# Patient Record
Sex: Male | Born: 1955 | Race: White | Hispanic: No | Marital: Married | Smoking: Never smoker
Health system: Southern US, Community
[De-identification: ages and names within clinical notes are randomized; demographics above are authoritative.]

## PROBLEM LIST (undated history)

## (undated) DIAGNOSIS — E785 Hyperlipidemia, unspecified: Secondary | ICD-10-CM

## (undated) DIAGNOSIS — R7989 Other specified abnormal findings of blood chemistry: Secondary | ICD-10-CM

## (undated) DIAGNOSIS — C44621 Squamous cell carcinoma of skin of unspecified upper limb, including shoulder: Secondary | ICD-10-CM

## (undated) DIAGNOSIS — Z8679 Personal history of other diseases of the circulatory system: Secondary | ICD-10-CM

## (undated) DIAGNOSIS — M51369 Other intervertebral disc degeneration, lumbar region without mention of lumbar back pain or lower extremity pain: Secondary | ICD-10-CM

## (undated) DIAGNOSIS — J189 Pneumonia, unspecified organism: Secondary | ICD-10-CM

## (undated) HISTORY — DX: Personal history of other diseases of the circulatory system: Z86.79

## (undated) HISTORY — PX: CYSTECTOMY: SUR359

## (undated) HISTORY — DX: Other intervertebral disc degeneration, lumbar region without mention of lumbar back pain or lower extremity pain: M51.369

## (undated) HISTORY — PX: LESION REMOVAL: SHX5196

## (undated) HISTORY — DX: Hyperlipidemia, unspecified: E78.5

## (undated) HISTORY — PX: HERNIA REPAIR: SHX51

---

## 2012-04-12 HISTORY — PX: COLONOSCOPY: SHX174

## 2012-07-05 DIAGNOSIS — E669 Obesity, unspecified: Secondary | ICD-10-CM | POA: Insufficient documentation

## 2012-07-05 DIAGNOSIS — E782 Mixed hyperlipidemia: Secondary | ICD-10-CM | POA: Insufficient documentation

## 2015-05-13 DIAGNOSIS — R7989 Other specified abnormal findings of blood chemistry: Secondary | ICD-10-CM | POA: Insufficient documentation

## 2016-01-21 DIAGNOSIS — Z85828 Personal history of other malignant neoplasm of skin: Secondary | ICD-10-CM | POA: Insufficient documentation

## 2017-04-12 HISTORY — PX: ESOPHAGOGASTRODUODENOSCOPY ENDOSCOPY: SHX5814

## 2017-08-02 ENCOUNTER — Other Ambulatory Visit: Payer: Self-pay | Admitting: Internal Medicine

## 2017-08-02 DIAGNOSIS — R599 Enlarged lymph nodes, unspecified: Secondary | ICD-10-CM

## 2017-08-09 ENCOUNTER — Ambulatory Visit
Admission: RE | Admit: 2017-08-09 | Discharge: 2017-08-09 | Disposition: A | Discharge status: Home / Self Care | Payer: BLUE CROSS/BLUE SHIELD | Source: Ambulatory Visit | Attending: Internal Medicine | Admitting: Internal Medicine

## 2017-08-09 DIAGNOSIS — M791 Myalgia, unspecified site: Secondary | ICD-10-CM | POA: Insufficient documentation

## 2017-08-09 DIAGNOSIS — R599 Enlarged lymph nodes, unspecified: Secondary | ICD-10-CM

## 2017-09-09 ENCOUNTER — Ambulatory Visit: Payer: Self-pay | Admitting: Family Medicine

## 2017-09-15 ENCOUNTER — Ambulatory Visit: Payer: Self-pay | Admitting: Family Medicine

## 2017-09-16 ENCOUNTER — Other Ambulatory Visit: Payer: Self-pay | Admitting: Otolaryngology

## 2017-09-16 DIAGNOSIS — R1310 Dysphagia, unspecified: Secondary | ICD-10-CM

## 2017-09-16 DIAGNOSIS — R221 Localized swelling, mass and lump, neck: Secondary | ICD-10-CM

## 2017-09-18 ENCOUNTER — Other Ambulatory Visit: Payer: Self-pay | Admitting: Radiology

## 2017-09-20 ENCOUNTER — Ambulatory Visit: Payer: Self-pay | Admitting: Family Medicine

## 2017-09-20 ENCOUNTER — Other Ambulatory Visit: Payer: Self-pay | Admitting: Otolaryngology

## 2017-09-20 ENCOUNTER — Ambulatory Visit
Admission: RE | Admit: 2017-09-20 | Discharge: 2017-09-20 | Disposition: A | Discharge status: Home / Self Care | Payer: BLUE CROSS/BLUE SHIELD | Source: Ambulatory Visit | Attending: Otolaryngology | Admitting: Otolaryngology

## 2017-09-20 DIAGNOSIS — Z8249 Family history of ischemic heart disease and other diseases of the circulatory system: Secondary | ICD-10-CM | POA: Diagnosis not present

## 2017-09-20 DIAGNOSIS — Z823 Family history of stroke: Secondary | ICD-10-CM | POA: Diagnosis not present

## 2017-09-20 DIAGNOSIS — Z881 Allergy status to other antibiotic agents status: Secondary | ICD-10-CM | POA: Diagnosis not present

## 2017-09-20 DIAGNOSIS — Z880 Allergy status to penicillin: Secondary | ICD-10-CM | POA: Diagnosis not present

## 2017-09-20 DIAGNOSIS — R221 Localized swelling, mass and lump, neck: Secondary | ICD-10-CM | POA: Diagnosis not present

## 2017-09-20 DIAGNOSIS — R1314 Dysphagia, pharyngoesophageal phase: Secondary | ICD-10-CM | POA: Diagnosis not present

## 2017-09-20 NOTE — Progress Notes (Signed)
Patient post neck mass biopsy per Dr Vernard Gambles, tolerated well. Vitals stable. Denies complaints. bandade dressing dry/intact. Discharge instructions given with questions answered.

## 2017-09-20 NOTE — Procedures (Signed)
  Procedure: [place] core L subcutaneous mass 18g x3 EBL:   minimal Complications:  none immediate  See full dictation in [facility].  Dillard Cannon MD Main # [phone] Pager  [phone]

## 2017-09-20 NOTE — Discharge Instructions (Signed)
Needle Biopsy, Care After °These instructions give you information about caring for yourself after your procedure. Your doctor may also give you more specific instructions. Call your doctor if you have any problems or questions after your procedure. °Follow these instructions at home: °· Rest as told by your doctor. °· Take medicines only as told by your doctor. °· There are many different ways to close and cover the biopsy site, including stitches (sutures), skin glue, and adhesive strips. Follow instructions from your doctor about: °? How to take care of your biopsy site. °? When and how you should change your bandage (dressing). °? When you should remove your dressing. °? Removing whatever was used to close your biopsy site. °· Check your biopsy site every day for signs of infection. Watch for: °? Redness, swelling, or pain. °? Fluid, blood, or pus. °Contact a doctor if: °· You have a fever. °· You have redness, swelling, or pain at the biopsy site, and it lasts longer than a few days. °· You have fluid, blood, or pus coming from the biopsy site. °· You feel sick to your stomach (nauseous). °· You throw up (vomit). °Get help right away if: °· You are short of breath. °· You have trouble breathing. °· Your chest hurts. °· You feel dizzy or you pass out (faint). °· You have bleeding that does not stop with pressure or a bandage. °· You cough up blood. °· Your belly (abdomen) hurts. °This information is not intended to replace advice given to you by your health care provider. Make sure you discuss any questions you have with your health care provider. °Document Released: 03/11/2008 Document Revised: 09/04/2015 Document Reviewed: 03/25/2014 °Elsevier Interactive Patient Education © 2018 [employer] ° °

## 2017-09-21 LAB — SURGICAL PATHOLOGY

## 2017-10-11 ENCOUNTER — Ambulatory Visit
Admission: RE | Admit: 2017-10-11 | Discharge: 2017-10-11 | Disposition: A | Discharge status: Home / Self Care | Payer: BLUE CROSS/BLUE SHIELD | Source: Ambulatory Visit | Attending: Otolaryngology | Admitting: Otolaryngology

## 2017-10-11 DIAGNOSIS — R1310 Dysphagia, unspecified: Secondary | ICD-10-CM | POA: Diagnosis not present

## 2017-10-11 NOTE — Therapy (Signed)
[place], [place], [address] Phone: [phone]   Fax:     Modified Barium Swallow  Patient Details  Name: Jeremy Clements MRN: [id number] Date of Birth: [birth year] No data recorded  Encounter Date: 10/11/2017  End of Session - 10/11/17 1347    Visit Number  1    Number of Visits  1    Date for SLP Re-Evaluation  10/11/17       No past medical history on file. .  There were no vitals filed for this visit.   Subjective: Patient behavior: (alertness, ability to follow instructions, etc.): The patient is able to express his swallowing complaints and follow directions  Chief complaint: Patient reports sensation of "blockage" (indicates entire length of esophagus, "anywhere between here and here"), pain (esophagus), and sense that foods/liquid move slowly through the esophagus.   Objective:  Radiological Procedure: A videoflouroscopic evaluation of oral-preparatory, reflex initiation, and pharyngeal phases of the swallow was performed; as well as a screening of the upper esophageal phase.  I. POSTURE: Upright in MBS chair and standing  II. VIEW:  Lateral and A-P  III. COMPENSATORY STRATEGIES: N/A  IV. BOLUSES ADMINISTERED:   Thin Liquid: 1 small, 3 rapid consecutive   Nectar-thick Liquid: 2 moderate   Honey-thick Liquid: DNT   Puree: 3 teaspoon presentations   Mechanical Soft: 1/4 graham cracker in applesauce  V. RESULTS OF EVALUATION: A. ORAL PREPARATORY PHASE: (The lips, tongue, and velum are observed for strength and coordination)       **Overall Severity Rating: Within normal limits  B. SWALLOW INITIATION/REFLEX: (The reflex is normal if "triggered" by the time the bolus reached the base of the tongue)  **Overall Severity Rating: Within normal limits  C. PHARYNGEAL PHASE: (Pharyngeal function is normal if the bolus shows rapid, smooth, and continuous transit through the  pharynx and there is no pharyngeal residue after the swallow)  **Overall Severity Rating: Within normal limits  D. LARYNGEAL PENETRATION: (Material entering into the laryngeal inlet/vestibule but not aspirated) Transient laryngeal penetration X2  E. ASPIRATION: None  F. ESOPHAGEAL PHASE: (Screening of the upper esophagus) .  An esophageal sweep in the upright position with liquid and solid consistencies showed distal-esophageal retention (momentary) with no observed retrograde flow below the level of the pharyngoesophageal segment.   The patient indicated his mid-esophagus when reporting point of "blockage" and occasional pain and subjective feeling of slow movement through the esophagus with liquid bolus.    ASSESSMENT: This 62 year old man; with left neck mass and complaint of difficulty swallowing solids and liquids; is presenting with functional oropharyngeal swallowing.  Oral control of the bolus including oral hold, rotary mastication, and anterior to posterior transfer is within normal limits.  Timing of pharyngeal swallow initiation is within normal limits.  Aspects of the pharyngeal stage of swallowing including tongue base retraction, hyolaryngeal excursion, epiglottic inversion, and duration/amplitude of UES opening are within normal limits.  There is no significant pharyngeal residue or tracheal aspiration.  The patient's complaints do not appear to be due to oropharyngeal swallow function.  An esophageal sweep in the upright position with liquid and solid consistencies showed distal-esophageal retention (momentary) with no observed retrograde flow below the level of the pharyngoesophageal segment.   The patient indicated his mid-esophagus when reporting point of "blockage" and occasional pain and subjective feeling of slow movement through the esophagus with liquid bolus.  The patient may benefit from further evaluation with GI  to assess esophageal function.  PLAN/RECOMMENDATIONS:   A.  Diet: Regular, soften moisten as needed for comfort   B. Swallowing Precautions: no special precautions indicated   C. Recommended consultation to: GI   D. Therapy recommendations: speech therapy is not indicated   E. Results and recommendations were discussed with the patient immediately following the study and the final report routed to the referring MD  Dysphagia, unspecified type - Plan: DG Swallowing Func-Speech Pathology, DG Swallowing Func-Speech Pathology        Problem List There are no active problems to display for this patient.  Leroy Sea, MS/CCC- SLP  Lou Miner 10/11/2017, 1:47 PM  [facility] [place], [place], [address] Phone: [phone]   Fax:     Name: Jeremy Clements MRN: [id number] Date of Birth: 30-Sep-1955

## 2017-11-10 ENCOUNTER — Ambulatory Visit: Payer: BLUE CROSS/BLUE SHIELD | Admitting: Gastroenterology

## 2018-01-16 DIAGNOSIS — K224 Dyskinesia of esophagus: Secondary | ICD-10-CM | POA: Insufficient documentation

## 2020-03-17 DIAGNOSIS — M6530 Trigger finger, unspecified finger: Secondary | ICD-10-CM | POA: Insufficient documentation

## 2020-03-17 DIAGNOSIS — M19049 Primary osteoarthritis, unspecified hand: Secondary | ICD-10-CM | POA: Insufficient documentation

## 2020-05-13 DIAGNOSIS — K439 Ventral hernia without obstruction or gangrene: Secondary | ICD-10-CM

## 2020-05-13 HISTORY — DX: Ventral hernia without obstruction or gangrene: K43.9

## 2020-05-22 ENCOUNTER — Other Ambulatory Visit: Payer: Self-pay | Admitting: General Surgery

## 2020-05-22 DIAGNOSIS — K439 Ventral hernia without obstruction or gangrene: Secondary | ICD-10-CM

## 2020-05-29 ENCOUNTER — Other Ambulatory Visit: Payer: Self-pay

## 2020-05-29 ENCOUNTER — Ambulatory Visit
Admission: RE | Admit: 2020-05-29 | Discharge: 2020-05-29 | Disposition: A | Discharge status: Home / Self Care | Payer: BC Managed Care – PPO | Source: Ambulatory Visit | Attending: General Surgery | Admitting: General Surgery

## 2020-05-29 DIAGNOSIS — K439 Ventral hernia without obstruction or gangrene: Secondary | ICD-10-CM | POA: Insufficient documentation

## 2020-05-29 MED ORDER — IOHEXOL 300 MG/ML  SOLN
100.0000 mL | Freq: Once | INTRAMUSCULAR | Status: AC | PRN
  Administered 2020-05-29: 100 mL via INTRAVENOUS

## 2020-06-05 ENCOUNTER — Ambulatory Visit: Payer: Self-pay | Admitting: General Surgery

## 2020-06-05 NOTE — H&P (View-Only) (Signed)
PATIENT PROFILE: Jeremy Clements is a 65 y.o. male who presents to the Clinic for consultation at the request of Dr. Sabra Clements for evaluation of umbilical hernia.  PCP: Jeremy Pax, MD  HISTORY OF PRESENT ILLNESS: Jeremy Clements reports patient report feeling a knot on the periumbilical area since a years ago. Initially he was thought that it was a lipoma and the recommendation was to proceed with observation. In the last few weeks he has been feeling more pain on the left side of the abdomen. The pain does not radiate to other part of the body. Aggravating factor is doing heavy lifting or pulling. He reports that the not in the periumbilical area fluctuates in size. He associated increase in size when he eats or when he does have lifting. He then feels that the not decreased a little bit in size. He does not have pain on the periumbilical area and the pain is always on the left side of the abdomen.  He also reports that many many years ago he used to have an area of the periumbilical area that he can remove a lot of hair coming from the inside of the skin. He denies any previous infection.  PROBLEM LIST: Problem List Date Reviewed: 07/12/2019  Noted  History of 2019 novel coronavirus disease (COVID-19) 07/16/2019  Overview  3/21, monoclonal antibody   Esophageal dysmotility 01/16/2018  Overview  Seen on EGD 10/19, [facility], manometry pending   Low serum testosterone level 05/13/2015  Overview  222, 2/20, no treatment   Hyperlipidemia, mixed 07/05/2012  Overview  Overview:  Chronic [Hyperlipidemia]     GENERAL REVIEW OF SYSTEMS:   General ROS: negative for - chills, fatigue, fever, weight gain or weight loss Allergy and Immunology ROS: negative for - hives  Hematological and Lymphatic ROS: negative for - bleeding problems or bruising, negative for palpable nodes Endocrine ROS: negative for - heat or cold intolerance, hair changes Respiratory ROS: negative for - cough, shortness of  breath or wheezing Cardiovascular ROS: no chest pain or palpitations GI ROS: negative for nausea, vomiting, abdominal pain, diarrhea, constipation Musculoskeletal ROS: negative for - joint swelling or muscle pain Neurological ROS: negative for - confusion, syncope Dermatological ROS: negative for pruritus and rash Psychiatric: negative for anxiety, depression, difficulty sleeping and memory loss  MEDICATIONS: Current Outpatient Medications  Medication Sig Dispense Refill  . ascorbic acid, vitamin C, (VITAMIN C) 1000 MG tablet Take 1,000 mg by mouth once daily  . cholecalciferol (VITAMIN D3) 1000 unit capsule Take 1,000 Units by mouth once daily  . ELDERBERRY FRUIT ORAL Take by mouth  . zinc sulfate (ZINC-15 ORAL) Take by mouth   No current facility-administered medications for this visit.   ALLERGIES: Tetracyclines and Amoxicillin  PAST MEDICAL HISTORY: Past Medical History:  Diagnosis Date  . Low testosterone   PAST SURGICAL HISTORY: Past Surgical History:  Procedure Laterality Date  . cystectomy on testicle  . ESOPHAGEAL DILATION N/A 01/12/2018  Procedure: FLIP; Surgeon: Leda Min, MD; Location: [facility]; Service: Gastroenterology; Laterality: N/A; This procedure is performed only by: Dr. Nancie Neas, Dr. Dorris Singh, and Dr. Ardelle Lesches  . ESOPHAGOGASTRODOUDENOSCOPY W/BIOPSY N/A 01/12/2018  Procedure: EGD with placement FLIP balloon to measure esophageal distensibility; Surgeon: Leda Min, MD; Location: [facility]; Service: Gastroenterology; Laterality: N/A; This procedure is performed only by: Dr. Nancie Neas, Dr. Dorris Singh, and Dr. Ardelle Lesches    FAMILY HISTORY: Family History  Problem Relation Age of Onset  . Rheum  arthritis Mother  . Leukemia Mother  . Rheum arthritis Maternal Grandmother  . Stroke Father  . Rheum arthritis Sister  . Dysphagia Sister    SOCIAL HISTORY: Social History   Socioeconomic History  .  Marital status: Married  Spouse name: Jeremy Clements  . Number of children: 6  . Years of education: Not on file  . Highest education level: Not on file  Occupational History  . Occupation: Pastor  Tobacco Use  . Smoking status: Never Smoker  . Smokeless tobacco: Never Used  Vaping Use  . Vaping Use: Never used  Substance and Sexual Activity  . Alcohol use: Never  Alcohol/week: 0.0 standard drinks  . Drug use: Never  . Sexual activity: Yes  Partners: Female  Birth control/protection: None  Other Topics Concern  . Not on file  Social History Narrative  . Not on file   Social Determinants of Health   Financial Resource Strain: Not on file  Food Insecurity: Not on file  Transportation Needs: Not on file   PHYSICAL EXAM: Vitals:  05/22/20 1051  BP: (!) 151/82  Pulse: 58   Body mass index is 36.59 kg/m. Weight: (!) 129.3 kg (285 lb)   GENERAL: Alert, active, oriented x3  HEENT: Pupils equal reactive to light. Extraocular movements are intact. Sclera clear. Palpebral conjunctiva normal red color.Pharynx clear.  NECK: Supple with no palpable mass and no adenopathy.  LUNGS: Sound clear with no rales rhonchi or wheezes.  HEART: Regular rhythm S1 and S2 without murmur.  ABDOMEN: Soft and depressible, nontender, no hepatomegaly. There is a lump in the periumbilical area superior and left lateral to the umbilicus. I partially reduce the content of suspected ventral hernia. No skin changes.  EXTREMITIES: Well-developed well-nourished symmetrical with no dependent edema.  NEUROLOGICAL: Awake alert oriented, facial expression symmetrical, moving all extremities.  REVIEW OF DATA: I have reviewed the following data today: Initial consult on 05/22/2020  Component Date Value  . Glucose 05/22/2020 108  . Sodium 05/22/2020 138  . Potassium 05/22/2020 4.8  . Chloride 05/22/2020 104  . Carbon Dioxide (CO2) 05/22/2020 32.2 (!)  . Urea Nitrogen (BUN) 05/22/2020 19  . Creatinine  05/22/2020 1.0  . Glomerular Filtration Ra* 05/22/2020 75  . Calcium 05/22/2020 9.5  . AST 05/22/2020 18  . ALT 05/22/2020 20  . Alk Phos (alkaline Phosp* 05/22/2020 71  . Albumin 05/22/2020 4.5  . Bilirubin, Total 05/22/2020 0.8  . Protein, Total 05/22/2020 7.0  . A/G Ratio 05/22/2020 1.8  Office Visit on 05/20/2020  Component Date Value  . SARS-CoV-2 Semi-Quant To* 05/20/2020 131.8  . SARS-CoV-2 Spike Ab Inte* 05/20/2020 Positive  Appointment on 03/17/2020  Component Date Value  . C Reactive Protein - Lab* 03/17/2020 4  . RA Latex Turbid. - [facility]* 03/17/2020 <10.0  . CCP Antibodies IgG/IgA -* 03/17/2020 4  . WBC (White Blood Cell Co* 03/17/2020 8.9  . RBC (Red Blood Cell Coun* 03/17/2020 5.10  . Hemoglobin 03/17/2020 15.8  . Hematocrit 03/17/2020 47.6  . MCV (Mean Corpuscular Vo* 03/17/2020 93.3  . MCH (Mean Corpuscular He* 03/17/2020 31.0  . MCHC (Mean Corpuscular H* 03/17/2020 33.2  . Platelet Count 03/17/2020 220  . RDW-CV (Red Cell Distrib* 03/17/2020 12.3  . MPV (Mean Platelet Volum* 03/17/2020 9.9  . Neutrophils 03/17/2020 5.98  . Lymphocytes 03/17/2020 2.07  . Monocytes 03/17/2020 0.61  . Eosinophils 03/17/2020 0.16  . Basophils 03/17/2020 0.05  . Neutrophil % 03/17/2020 67.1  . Lymphocyte % 03/17/2020 23.3  . Monocyte %  03/17/2020 6.9  . Eosinophil % 03/17/2020 1.8  . Basophil% 03/17/2020 0.6  . Immature Granulocyte % 03/17/2020 0.3  . Immature Granulocyte Cou* 03/17/2020 0.03  . Glucose 03/17/2020 81  . Sodium 03/17/2020 140  . Potassium 03/17/2020 4.4  . Chloride 03/17/2020 106  . Carbon Dioxide (CO2) 03/17/2020 27.5  . Urea Nitrogen (BUN) 03/17/2020 12  . Creatinine 03/17/2020 1.0  . Glomerular Filtration Ra* 03/17/2020 76  . Calcium 03/17/2020 9.3  . AST 03/17/2020 20  . ALT 03/17/2020 22  . Alk Phos (alkaline Phosp* 03/17/2020 60  . Albumin 03/17/2020 4.5  . Bilirubin, Total 03/17/2020 0.4  . Protein, Total 03/17/2020 6.8  . A/G Ratio 03/17/2020  2.0    ASSESSMENT: Mr. Lohmeyer is a 65 y.o. male presenting for consultation for umbilical hernia.  This patient with physical exam mostly consistent with a partially reducible umbilical hernia. The fact that the pain is not localized to the periumbilical area and is more to the left side of the abdomen and with this history of having an area around the umbilicus where he used to remove lots of hair I am concerned that multiple processes can be happening in this area. 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 My recommendation is to proceed with CT scan of the abdomen and pelvis for adequate evaluation of the abdominal wall before proceeding with surgical management. CT scan will help with decision-making regarding the surgery and next step of management.  CT scan was ordered and ventral hernia was confirmed without any other pathology. On imaging, hernia content is fat tissue. Patient oriented about findings and recommendations of hernia repair. Oriented about risk of surgery that includes bleeding, infection, injury to intestine or other organs, pain, fistula, scar, among others. Patient reports understood and agreed to proceed.   Ventral hernia without obstruction or gangrene [K43.9]  PLAN: 1. Robotic assisted laparoscopic ventral hernia repair with mesh  Patient and and his wife verbalized understanding, all questions were answered, and were agreeable with the plan outlined above.   Herbert Pun, MD  Electronically signed by Herbert Pun, MD

## 2020-06-05 NOTE — H&P (Signed)
PATIENT PROFILE: Jeremy Clements is a 65 y.o. male who presents to the Clinic for consultation at the request of Dr. Miller for evaluation of umbilical hernia.  PCP: Miller, Mark Frederic, MD  HISTORY OF PRESENT ILLNESS: Jeremy Clements reports patient report feeling a knot on the periumbilical area since a years ago. Initially he was thought that it was a lipoma and the recommendation was to proceed with observation. In the last few weeks he has been feeling more pain on the left side of the abdomen. The pain does not radiate to other part of the body. Aggravating factor is doing heavy lifting or pulling. He reports that the not in the periumbilical area fluctuates in size. He associated increase in size when he eats or when he does have lifting. He then feels that the not decreased a little bit in size. He does not have pain on the periumbilical area and the pain is always on the left side of the abdomen.  He also reports that many many years ago he used to have an area of the periumbilical area that he can remove a lot of hair coming from the inside of the skin. He denies any previous infection.  PROBLEM LIST: Problem List Date Reviewed: 07/12/2019  Noted  History of 2019 novel coronavirus disease (COVID-19) 07/16/2019  Overview  3/21, monoclonal antibody   Esophageal dysmotility 01/16/2018  Overview  Seen on EGD 10/19, [facility], manometry pending   Low serum testosterone level 05/13/2015  Overview  222, 2/20, no treatment   Hyperlipidemia, mixed 07/05/2012  Overview  Overview:  Chronic [Hyperlipidemia]     GENERAL REVIEW OF SYSTEMS:   General ROS: negative for - chills, fatigue, fever, weight gain or weight loss Allergy and Immunology ROS: negative for - hives  Hematological and Lymphatic ROS: negative for - bleeding problems or bruising, negative for palpable nodes Endocrine ROS: negative for - heat or cold intolerance, hair changes Respiratory ROS: negative for - cough, shortness of  breath or wheezing Cardiovascular ROS: no chest pain or palpitations GI ROS: negative for nausea, vomiting, abdominal pain, diarrhea, constipation Musculoskeletal ROS: negative for - joint swelling or muscle pain Neurological ROS: negative for - confusion, syncope Dermatological ROS: negative for pruritus and rash Psychiatric: negative for anxiety, depression, difficulty sleeping and memory loss  MEDICATIONS: Current Outpatient Medications  Medication Sig Dispense Refill  . ascorbic acid, vitamin C, (VITAMIN C) 1000 MG tablet Take 1,000 mg by mouth once daily  . cholecalciferol (VITAMIN D3) 1000 unit capsule Take 1,000 Units by mouth once daily  . ELDERBERRY FRUIT ORAL Take by mouth  . zinc sulfate (ZINC-15 ORAL) Take by mouth   No current facility-administered medications for this visit.   ALLERGIES: Tetracyclines and Amoxicillin  PAST MEDICAL HISTORY: Past Medical History:  Diagnosis Date  . Low testosterone   PAST SURGICAL HISTORY: Past Surgical History:  Procedure Laterality Date  . cystectomy on testicle  . ESOPHAGEAL DILATION N/A 01/12/2018  Procedure: FLIP; Surgeon: Shimpi, Rahul Arun, MD; Location: [facility]; Service: Gastroenterology; Laterality: N/A; This procedure is performed only by: Dr. Richard Wood, Dr. David Leiman, and Dr. Rahul Shimpi  . ESOPHAGOGASTRODOUDENOSCOPY W/BIOPSY N/A 01/12/2018  Procedure: EGD with placement FLIP balloon to measure esophageal distensibility; Surgeon: Shimpi, Rahul Arun, MD; Location: [facility]; Service: Gastroenterology; Laterality: N/A; This procedure is performed only by: Dr. Richard Wood, Dr. David Leiman, and Dr. Rahul Shimpi    FAMILY HISTORY: Family History  Problem Relation Age of Onset  . Rheum   arthritis Mother  . Leukemia Mother  . Rheum arthritis Maternal Grandmother  . Stroke Father  . Rheum arthritis Sister  . Dysphagia Sister    SOCIAL HISTORY: Social History   Socioeconomic History  .  Marital status: Married  Spouse name: Donna Scrivner  . Number of children: 6  . Years of education: Not on file  . Highest education level: Not on file  Occupational History  . Occupation: Pastor  Tobacco Use  . Smoking status: Never Smoker  . Smokeless tobacco: Never Used  Vaping Use  . Vaping Use: Never used  Substance and Sexual Activity  . Alcohol use: Never  Alcohol/week: 0.0 standard drinks  . Drug use: Never  . Sexual activity: Yes  Partners: Female  Birth control/protection: None  Other Topics Concern  . Not on file  Social History Narrative  . Not on file   Social Determinants of Health   Financial Resource Strain: Not on file  Food Insecurity: Not on file  Transportation Needs: Not on file   PHYSICAL EXAM: Vitals:  05/22/20 1051  BP: (!) 151/82  Pulse: 58   Body mass index is 36.59 kg/m. Weight: (!) 129.3 kg (285 lb)   GENERAL: Alert, active, oriented x3  HEENT: Pupils equal reactive to light. Extraocular movements are intact. Sclera clear. Palpebral conjunctiva normal red color.Pharynx clear.  NECK: Supple with no palpable mass and no adenopathy.  LUNGS: Sound clear with no rales rhonchi or wheezes.  HEART: Regular rhythm S1 and S2 without murmur.  ABDOMEN: Soft and depressible, nontender, no hepatomegaly. There is a lump in the periumbilical area superior and left lateral to the umbilicus. I partially reduce the content of suspected ventral hernia. No skin changes.  EXTREMITIES: Well-developed well-nourished symmetrical with no dependent edema.  NEUROLOGICAL: Awake alert oriented, facial expression symmetrical, moving all extremities.  REVIEW OF DATA: I have reviewed the following data today: Initial consult on 05/22/2020  Component Date Value  . Glucose 05/22/2020 108  . Sodium 05/22/2020 138  . Potassium 05/22/2020 4.8  . Chloride 05/22/2020 104  . Carbon Dioxide (CO2) 05/22/2020 32.2 (!)  . Urea Nitrogen (BUN) 05/22/2020 19  . Creatinine  05/22/2020 1.0  . Glomerular Filtration Ra* 05/22/2020 75  . Calcium 05/22/2020 9.5  . AST 05/22/2020 18  . ALT 05/22/2020 20  . Alk Phos (alkaline Phosp* 05/22/2020 71  . Albumin 05/22/2020 4.5  . Bilirubin, Total 05/22/2020 0.8  . Protein, Total 05/22/2020 7.0  . A/G Ratio 05/22/2020 1.8  Office Visit on 05/20/2020  Component Date Value  . SARS-CoV-2 Semi-Quant To* 05/20/2020 131.8  . SARS-CoV-2 Spike Ab Inte* 05/20/2020 Positive  Appointment on 03/17/2020  Component Date Value  . C Reactive Protein - Lab* 03/17/2020 4  . RA Latex Turbid. - Lab[facility]* 03/17/2020 <10.0  . CCP Antibodies IgG/IgA -* 03/17/2020 4  . WBC (White Blood Cell Co* 03/17/2020 8.9  . RBC (Red Blood Cell Coun* 03/17/2020 5.10  . Hemoglobin 03/17/2020 15.8  . Hematocrit 03/17/2020 47.6  . MCV (Mean Corpuscular Vo* 03/17/2020 93.3  . MCH (Mean Corpuscular He* 03/17/2020 31.0  . MCHC (Mean Corpuscular H* 03/17/2020 33.2  . Platelet Count 03/17/2020 220  . RDW-CV (Red Cell Distrib* 03/17/2020 12.3  . MPV (Mean Platelet Volum* 03/17/2020 9.9  . Neutrophils 03/17/2020 5.98  . Lymphocytes 03/17/2020 2.07  . Monocytes 03/17/2020 0.61  . Eosinophils 03/17/2020 0.16  . Basophils 03/17/2020 0.05  . Neutrophil % 03/17/2020 67.1  . Lymphocyte % 03/17/2020 23.3  . Monocyte %   03/17/2020 6.9  . Eosinophil % 03/17/2020 1.8  . Basophil% 03/17/2020 0.6  . Immature Granulocyte % 03/17/2020 0.3  . Immature Granulocyte Cou* 03/17/2020 0.03  . Glucose 03/17/2020 81  . Sodium 03/17/2020 140  . Potassium 03/17/2020 4.4  . Chloride 03/17/2020 106  . Carbon Dioxide (CO2) 03/17/2020 27.5  . Urea Nitrogen (BUN) 03/17/2020 12  . Creatinine 03/17/2020 1.0  . Glomerular Filtration Ra* 03/17/2020 76  . Calcium 03/17/2020 9.3  . AST 03/17/2020 20  . ALT 03/17/2020 22  . Alk Phos (alkaline Phosp* 03/17/2020 60  . Albumin 03/17/2020 4.5  . Bilirubin, Total 03/17/2020 0.4  . Protein, Total 03/17/2020 6.8  . A/G Ratio 03/17/2020  2.0    ASSESSMENT: Jeremy Clements is a 65 y.o. male presenting for consultation for umbilical hernia.  This patient with physical exam mostly consistent with a partially reducible umbilical hernia. The fact that the pain is not localized to the periumbilical area and is more to the left side of the abdomen and with this history of having an area around the umbilicus where he used to remove lots of hair I am concerned that multiple processes can be happening in this area. 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 My recommendation is to proceed with CT scan of the abdomen and pelvis for adequate evaluation of the abdominal wall before proceeding with surgical management. CT scan will help with decision-making regarding the surgery and next step of management.  CT scan was ordered and ventral hernia was confirmed without any other pathology. On imaging, hernia content is fat tissue. Patient oriented about findings and recommendations of hernia repair. Oriented about risk of surgery that includes bleeding, infection, injury to intestine or other organs, pain, fistula, scar, among others. Patient reports understood and agreed to proceed.   Ventral hernia without obstruction or gangrene [K43.9]  PLAN: 1. Robotic assisted laparoscopic ventral hernia repair with mesh  Patient and and his wife verbalized understanding, all questions were answered, and were agreeable with the plan outlined above.   Sahira Cataldi Cintron-Diaz, MD  Electronically signed by Nakia Koble Cintron-Diaz, MD    

## 2020-06-10 ENCOUNTER — Other Ambulatory Visit: Payer: Self-pay

## 2020-06-10 ENCOUNTER — Encounter
Admission: RE | Admit: 2020-06-10 | Discharge: 2020-06-10 | Disposition: A | Discharge status: Home / Self Care | Payer: BC Managed Care – PPO | Source: Ambulatory Visit | Attending: General Surgery | Admitting: General Surgery

## 2020-06-10 HISTORY — DX: Pneumonia, unspecified organism: J18.9

## 2020-06-10 HISTORY — DX: Other specified abnormal findings of blood chemistry: R79.89

## 2020-06-10 HISTORY — DX: Squamous cell carcinoma of skin of unspecified upper limb, including shoulder: C44.621

## 2020-06-10 NOTE — Patient Instructions (Addendum)
Your procedure is scheduled on: Monday, March 7 Report to the Registration Desk on the 1st floor of the [facility]. To find out your arrival time, please call [phone] between 1PM - 3PM on: Friday, March 4  REMEMBER: Instructions that are not followed completely may result in serious medical risk, up to and including death; or upon the discretion of your surgeon and anesthesiologist your surgery may need to be rescheduled.  Do not eat food after midnight the night before surgery.  No gum chewing, lozengers or hard candies.  You may however, drink CLEAR liquids up to 2 hours before you are scheduled to arrive for your surgery. Do not drink anything within 2 hours of your scheduled arrival time.  Clear liquids include: - water  - apple juice without pulp - gatorade (not RED, PURPLE, OR BLUE) - [place] coffee or tea (Do NOT add milk or creamers to the coffee or tea) Do NOT drink anything that is not on this list.  DO NOT TAKE ANY MEDICATIONS THE MORNING OF SURGERY  One week prior to surgery: STARTING TODAY, MARCH 1 Stop Anti-inflammatories (NSAIDS) such as Advil, Aleve, Ibuprofen, Motrin, Naproxen, Naprosyn and Aspirin based products such as Excedrin, Goodys Powder, BC Powder. Stop ANY OVER THE COUNTER supplements until after surgery.  No Alcohol for 24 hours before or after surgery.  No Smoking including e-cigarettes for 24 hours prior to surgery.  No chewable tobacco products for at least 6 hours prior to surgery.  No nicotine patches on the day of surgery.  Do not use any "recreational" drugs for at least a week prior to your surgery.  Please be advised that the combination of cocaine and anesthesia may have negative outcomes, up to and including death. If you test positive for cocaine, your surgery will be cancelled.  On the morning of surgery brush your teeth with toothpaste and water, you may rinse your mouth with mouthwash if you wish. Do not swallow any toothpaste or  mouthwash.  Do not wear jewelry, make-up, hairpins, clips or nail polish.  Do not wear lotions, powders, or perfumes.   Do not shave body from the neck down 48 hours prior to surgery just in case you cut yourself which could leave a site for infection.  Also, freshly shaved skin may become irritated if using the CHG soap.  Do not bring valuables to the hospital. [facility] is not responsible for any missing/lost belongings or valuables.   Use CHG Soap as directed on instruction sheet.  Notify your doctor if there is any change in your medical condition (cold, fever, infection).  Wear comfortable clothing (specific to your surgery type) to the hospital.  Plan for stool softeners for home use; pain medications have a tendency to cause constipation. You can also help prevent constipation by eating foods high in fiber such as fruits and vegetables and drinking plenty of fluids as your diet allows.  After surgery, you can help prevent lung complications by doing breathing exercises.  Take deep breaths and cough every 1-2 hours. Your doctor may order a device called an Incentive Spirometer to help you take deep breaths. When coughing or sneezing, hold a pillow firmly against your incision with both hands. This is called "splinting." Doing this helps protect your incision. It also decreases belly discomfort.  If you are being discharged the day of surgery, you will not be allowed to drive home. You will need a responsible adult (18 years or older) to drive you home  and stay with you that night.   If you are taking public transportation, you will need to have a responsible adult (18 years or older) with you. Please confirm with your physician that it is acceptable to use public transportation.   Please call the [facility] at [phone] if you have any questions about these instructions.  Surgery Visitation Policy:  Patients undergoing a surgery or procedure may have  one family member or support person with them as long as that person is not COVID-19 positive or experiencing its symptoms.  That person may remain in the waiting area during the procedure.

## 2020-06-12 ENCOUNTER — Other Ambulatory Visit: Payer: Self-pay

## 2020-06-12 ENCOUNTER — Other Ambulatory Visit
Admission: RE | Admit: 2020-06-12 | Discharge: 2020-06-12 | Disposition: A | Discharge status: Home / Self Care | Payer: BC Managed Care – PPO | Source: Ambulatory Visit | Attending: General Surgery | Admitting: General Surgery

## 2020-06-12 DIAGNOSIS — Z20822 Contact with and (suspected) exposure to covid-19: Secondary | ICD-10-CM | POA: Diagnosis not present

## 2020-06-12 DIAGNOSIS — Z01812 Encounter for preprocedural laboratory examination: Secondary | ICD-10-CM | POA: Insufficient documentation

## 2020-06-12 LAB — SARS CORONAVIRUS 2 (TAT 6-24 HRS): SARS Coronavirus 2: NEGATIVE

## 2020-06-15 MED ORDER — ORAL CARE MOUTH RINSE: 15.0000 mL | Freq: Once | OROMUCOSAL | Status: AC

## 2020-06-15 MED ORDER — CLINDAMYCIN PHOSPHATE 900 MG/50ML IV SOLN
900.0000 mg | INTRAVENOUS | Status: AC
  Administered 2020-06-16: 900 mg via INTRAVENOUS

## 2020-06-15 MED ORDER — LACTATED RINGERS IV SOLN: INTRAVENOUS | Status: DC

## 2020-06-15 MED ORDER — FAMOTIDINE 20 MG PO TABS: 20.0000 mg | ORAL_TABLET | Freq: Once | ORAL | Status: AC

## 2020-06-15 MED ORDER — CHLORHEXIDINE GLUCONATE 0.12 % MT SOLN
15.0000 mL | Freq: Once | OROMUCOSAL | Status: AC
  Administered 2020-06-16: 15 mL via OROMUCOSAL

## 2020-06-16 ENCOUNTER — Ambulatory Visit
Admission: RE | Admit: 2020-06-16 | Discharge: 2020-06-16 | Disposition: A | Discharge status: Home / Self Care | Payer: BC Managed Care – PPO | Attending: General Surgery | Admitting: General Surgery

## 2020-06-16 ENCOUNTER — Encounter
Admission: RE | Disposition: A | Discharge status: Home / Self Care | Payer: Self-pay | Source: Home / Self Care | Attending: General Surgery

## 2020-06-16 ENCOUNTER — Ambulatory Visit: Payer: BC Managed Care – PPO | Admitting: Anesthesiology

## 2020-06-16 ENCOUNTER — Other Ambulatory Visit: Payer: Self-pay

## 2020-06-16 ENCOUNTER — Encounter: Payer: Self-pay | Admitting: General Surgery

## 2020-06-16 DIAGNOSIS — K430 Incisional hernia with obstruction, without gangrene: Secondary | ICD-10-CM | POA: Insufficient documentation

## 2020-06-16 DIAGNOSIS — Z806 Family history of leukemia: Secondary | ICD-10-CM | POA: Diagnosis not present

## 2020-06-16 DIAGNOSIS — Z79899 Other long term (current) drug therapy: Secondary | ICD-10-CM | POA: Diagnosis not present

## 2020-06-16 DIAGNOSIS — K429 Umbilical hernia without obstruction or gangrene: Secondary | ICD-10-CM | POA: Diagnosis present

## 2020-06-16 DIAGNOSIS — Z8261 Family history of arthritis: Secondary | ICD-10-CM | POA: Insufficient documentation

## 2020-06-16 DIAGNOSIS — Z8616 Personal history of COVID-19: Secondary | ICD-10-CM | POA: Diagnosis not present

## 2020-06-16 DIAGNOSIS — Z880 Allergy status to penicillin: Secondary | ICD-10-CM | POA: Diagnosis not present

## 2020-06-16 DIAGNOSIS — Z823 Family history of stroke: Secondary | ICD-10-CM | POA: Insufficient documentation

## 2020-06-16 DIAGNOSIS — Z8379 Family history of other diseases of the digestive system: Secondary | ICD-10-CM | POA: Diagnosis not present

## 2020-06-16 SURGERY — REPAIR, HERNIA, UMBILICAL, ROBOT-ASSISTED
Anesthesia: General | Site: Abdomen

## 2020-06-16 MED ORDER — MEPERIDINE HCL 50 MG/ML IJ SOLN: 6.2500 mg | INTRAMUSCULAR | Status: DC | PRN

## 2020-06-16 MED ORDER — SUGAMMADEX SODIUM 200 MG/2ML IV SOLN
INTRAVENOUS | Status: DC | PRN
  Administered 2020-06-16: 300 mg via INTRAVENOUS

## 2020-06-16 MED ORDER — PROPOFOL 10 MG/ML IV BOLUS
INTRAVENOUS | Status: AC
  Filled 2020-06-16: qty 20

## 2020-06-16 MED ORDER — ACETAMINOPHEN 10 MG/ML IV SOLN
INTRAVENOUS | Status: AC
  Filled 2020-06-16: qty 100

## 2020-06-16 MED ORDER — CLINDAMYCIN PHOSPHATE 900 MG/50ML IV SOLN
INTRAVENOUS | Status: AC
  Filled 2020-06-16: qty 50

## 2020-06-16 MED ORDER — HYDROCODONE-ACETAMINOPHEN 5-325 MG PO TABS: 1.0000 | ORAL_TABLET | ORAL | 0 refills | Status: AC | PRN

## 2020-06-16 MED ORDER — DEXAMETHASONE SODIUM PHOSPHATE 10 MG/ML IJ SOLN
INTRAMUSCULAR | Status: DC | PRN
  Administered 2020-06-16: 10 mg via INTRAVENOUS

## 2020-06-16 MED ORDER — KETOROLAC TROMETHAMINE 30 MG/ML IJ SOLN
INTRAMUSCULAR | Status: DC | PRN
  Administered 2020-06-16: 30 mg via INTRAVENOUS

## 2020-06-16 MED ORDER — ROCURONIUM BROMIDE 10 MG/ML (PF) SYRINGE
PREFILLED_SYRINGE | INTRAVENOUS | Status: AC
  Filled 2020-06-16: qty 10

## 2020-06-16 MED ORDER — KETOROLAC TROMETHAMINE 30 MG/ML IJ SOLN
INTRAMUSCULAR | Status: AC
  Filled 2020-06-16: qty 1

## 2020-06-16 MED ORDER — LIDOCAINE HCL (PF) 2 % IJ SOLN
INTRAMUSCULAR | Status: AC
  Filled 2020-06-16: qty 5

## 2020-06-16 MED ORDER — PROMETHAZINE HCL 25 MG/ML IJ SOLN: 6.2500 mg | INTRAMUSCULAR | Status: DC | PRN

## 2020-06-16 MED ORDER — FENTANYL CITRATE (PF) 100 MCG/2ML IJ SOLN
INTRAMUSCULAR | Status: AC
  Administered 2020-06-16: 50 ug via INTRAVENOUS
  Filled 2020-06-16: qty 2

## 2020-06-16 MED ORDER — LIDOCAINE HCL (CARDIAC) PF 100 MG/5ML IV SOSY
PREFILLED_SYRINGE | INTRAVENOUS | Status: DC | PRN
  Administered 2020-06-16: 100 mg via INTRAVENOUS

## 2020-06-16 MED ORDER — OXYCODONE HCL 5 MG PO TABS
ORAL_TABLET | ORAL | Status: AC
  Filled 2020-06-16: qty 1

## 2020-06-16 MED ORDER — BUPIVACAINE-EPINEPHRINE (PF) 0.25% -1:200000 IJ SOLN
INTRAMUSCULAR | Status: AC
  Filled 2020-06-16: qty 30

## 2020-06-16 MED ORDER — DEXAMETHASONE SODIUM PHOSPHATE 10 MG/ML IJ SOLN
INTRAMUSCULAR | Status: AC
  Filled 2020-06-16: qty 1

## 2020-06-16 MED ORDER — FENTANYL CITRATE (PF) 100 MCG/2ML IJ SOLN
INTRAMUSCULAR | Status: AC
  Administered 2020-06-16: 25 ug via INTRAVENOUS
  Filled 2020-06-16: qty 2

## 2020-06-16 MED ORDER — ONDANSETRON HCL 4 MG/2ML IJ SOLN
INTRAMUSCULAR | Status: AC
  Filled 2020-06-16: qty 2

## 2020-06-16 MED ORDER — PROPOFOL 10 MG/ML IV BOLUS
INTRAVENOUS | Status: DC | PRN
  Administered 2020-06-16: 170 mg via INTRAVENOUS

## 2020-06-16 MED ORDER — FENTANYL CITRATE (PF) 100 MCG/2ML IJ SOLN
25.0000 ug | INTRAMUSCULAR | Status: DC | PRN
  Administered 2020-06-16: 50 ug via INTRAVENOUS
  Administered 2020-06-16: 25 ug via INTRAVENOUS

## 2020-06-16 MED ORDER — MIDAZOLAM HCL 2 MG/2ML IJ SOLN
INTRAMUSCULAR | Status: AC
  Filled 2020-06-16: qty 2

## 2020-06-16 MED ORDER — FENTANYL CITRATE (PF) 100 MCG/2ML IJ SOLN
INTRAMUSCULAR | Status: AC
  Filled 2020-06-16: qty 2

## 2020-06-16 MED ORDER — FENTANYL CITRATE (PF) 100 MCG/2ML IJ SOLN
INTRAMUSCULAR | Status: DC | PRN
  Administered 2020-06-16 (×2): 50 ug via INTRAVENOUS

## 2020-06-16 MED ORDER — PHENYLEPHRINE HCL (PRESSORS) 10 MG/ML IV SOLN
INTRAVENOUS | Status: DC | PRN
  Administered 2020-06-16: 100 ug via INTRAVENOUS

## 2020-06-16 MED ORDER — ONDANSETRON HCL 4 MG/2ML IJ SOLN
INTRAMUSCULAR | Status: DC | PRN
  Administered 2020-06-16: 4 mg via INTRAVENOUS

## 2020-06-16 MED ORDER — FAMOTIDINE 20 MG PO TABS
ORAL_TABLET | ORAL | Status: AC
  Administered 2020-06-16: 20 mg via ORAL
  Filled 2020-06-16: qty 1

## 2020-06-16 MED ORDER — HYDROCODONE-ACETAMINOPHEN 5-325 MG PO TABS
ORAL_TABLET | ORAL | Status: AC
  Filled 2020-06-16: qty 1

## 2020-06-16 MED ORDER — MIDAZOLAM HCL 2 MG/2ML IJ SOLN
INTRAMUSCULAR | Status: DC | PRN
  Administered 2020-06-16: 2 mg via INTRAVENOUS

## 2020-06-16 MED ORDER — BUPIVACAINE-EPINEPHRINE (PF) 0.25% -1:200000 IJ SOLN
INTRAMUSCULAR | Status: DC | PRN
  Administered 2020-06-16: 30 mL

## 2020-06-16 MED ORDER — SODIUM CHLORIDE FLUSH 0.9 % IV SOLN
INTRAVENOUS | Status: AC
  Filled 2020-06-16: qty 10

## 2020-06-16 MED ORDER — OXYCODONE HCL 5 MG PO TABS
5.0000 mg | ORAL_TABLET | Freq: Once | ORAL | Status: AC | PRN
Start: 2020-06-16 — End: 2020-06-16
  Administered 2020-06-16: 5 mg via ORAL

## 2020-06-16 MED ORDER — OXYCODONE HCL 5 MG/5ML PO SOLN: 5.0000 mg | Freq: Once | ORAL | Status: AC | PRN

## 2020-06-16 MED ORDER — ROCURONIUM BROMIDE 100 MG/10ML IV SOLN
INTRAVENOUS | Status: DC | PRN
  Administered 2020-06-16: 60 mg via INTRAVENOUS
  Administered 2020-06-16: 10 mg via INTRAVENOUS

## 2020-06-16 MED ORDER — ACETAMINOPHEN 10 MG/ML IV SOLN
INTRAVENOUS | Status: DC | PRN
  Administered 2020-06-16: 1000 mg via INTRAVENOUS

## 2020-06-16 SURGICAL SUPPLY — 49 items
ADH SKN CLS APL DERMABOND .7 (GAUZE/BANDAGES/DRESSINGS) ×1
APL PRP STRL LF DISP 70% ISPRP (MISCELLANEOUS) ×1
BAG INFUSER PRESSURE 100CC (MISCELLANEOUS) IMPLANT
BLADE SURG SZ11 CARB STEEL (BLADE) ×2 IMPLANT
CANISTER SUCT 1200ML W/VALVE (MISCELLANEOUS) ×1 IMPLANT
CHLORAPREP W/TINT 26 (MISCELLANEOUS) ×2 IMPLANT
COVER TIP SHEARS 8 DVNC (MISCELLANEOUS) ×1 IMPLANT
COVER TIP SHEARS 8MM DA VINCI (MISCELLANEOUS) ×1
COVER WAND RF STERILE (DRAPES) ×2 IMPLANT
DEFOGGER SCOPE WARMER CLEARIFY (MISCELLANEOUS) ×2 IMPLANT
DERMABOND ADVANCED (GAUZE/BANDAGES/DRESSINGS) ×1
DERMABOND ADVANCED .7 DNX12 (GAUZE/BANDAGES/DRESSINGS) ×1 IMPLANT
DRAPE ARM DVNC X/XI (DISPOSABLE) ×3 IMPLANT
DRAPE COLUMN DVNC XI (DISPOSABLE) ×1 IMPLANT
DRAPE DA VINCI XI ARM (DISPOSABLE) ×3
DRAPE DA VINCI XI COLUMN (DISPOSABLE) ×1
ELECT REM PT RETURN 9FT ADLT (ELECTROSURGICAL) ×2
ELECTRODE REM PT RTRN 9FT ADLT (ELECTROSURGICAL) ×1 IMPLANT
GLOVE SURG ENC MOIS LTX SZ6.5 (GLOVE) ×6 IMPLANT
GLOVE SURG UNDER POLY LF SZ6.5 (GLOVE) ×6 IMPLANT
GOWN STRL REUS W/ TWL LRG LVL3 (GOWN DISPOSABLE) ×3 IMPLANT
GOWN STRL REUS W/TWL LRG LVL3 (GOWN DISPOSABLE) ×8
IRRIGATOR SUCT 8 DISP DVNC XI (IRRIGATION / IRRIGATOR) IMPLANT
IRRIGATOR SUCTION 8MM XI DISP (IRRIGATION / IRRIGATOR)
IV NS 1000ML (IV SOLUTION)
IV NS 1000ML BAXH (IV SOLUTION) IMPLANT
KIT PINK PAD W/HEAD ARE REST (MISCELLANEOUS) ×2
KIT PINK PAD W/HEAD ARM REST (MISCELLANEOUS) ×1 IMPLANT
LABEL OR SOLS (LABEL) ×2 IMPLANT
MANIFOLD NEPTUNE II (INSTRUMENTS) ×2 IMPLANT
MESH VENTRALIGHT ST 4.5IN (Mesh General) ×1 IMPLANT
NDL INSUFFLATION 14GA 120MM (NEEDLE) ×1 IMPLANT
NEEDLE HYPO 22GX1.5 SAFETY (NEEDLE) ×2 IMPLANT
NEEDLE INSUFFLATION 14GA 120MM (NEEDLE) ×2 IMPLANT
NS IRRIG 500ML POUR BTL (IV SOLUTION) ×2 IMPLANT
OBTURATOR OPTICAL STANDARD 8MM (TROCAR) ×1
OBTURATOR OPTICAL STND 8 DVNC (TROCAR) ×1
OBTURATOR OPTICALSTD 8 DVNC (TROCAR) ×1 IMPLANT
PACK LAP CHOLECYSTECTOMY (MISCELLANEOUS) ×2 IMPLANT
SEAL CANN UNIV 5-8 DVNC XI (MISCELLANEOUS) ×3 IMPLANT
SEAL XI 5MM-8MM UNIVERSAL (MISCELLANEOUS) ×3
SET TUBE SMOKE EVAC HIGH FLOW (TUBING) ×2 IMPLANT
SOLUTION ELECTROLUBE (MISCELLANEOUS) ×2 IMPLANT
SUT MNCRL 4-0 (SUTURE) ×2
SUT MNCRL 4-0 27XMFL (SUTURE) ×1
SUT STRATAFIX PDS 30 CT-1 (SUTURE) ×2 IMPLANT
SUT VICRYL 0 AB UR-6 (SUTURE) ×2 IMPLANT
SUT VLOC 90 S/L VL9 GS22 (SUTURE) ×2 IMPLANT
SUTURE MNCRL 4-0 27XMF (SUTURE) ×1 IMPLANT

## 2020-06-16 NOTE — Anesthesia Preprocedure Evaluation (Signed)
Anesthesia Evaluation  Patient identified by MRN, date of birth, ID band Patient awake    Reviewed: Allergy & Precautions, NPO status , Patient's Chart, lab work & pertinent test results  History of Anesthesia Complications Negative for: history of anesthetic complications  Airway Mallampati: III  TM Distance: >3 FB Neck ROM: Full    Dental no notable dental hx.    Pulmonary neg pulmonary ROS, neg sleep apnea, neg COPD,    breath sounds clear to auscultation- rhonchi (-) wheezing      Cardiovascular Exercise Tolerance: Good (-) hypertension(-) CAD, (-) Past MI, (-) Cardiac Stents and (-) CABG  Rhythm:Regular Rate:Normal - Systolic murmurs and - Diastolic murmurs    Neuro/Psych neg Seizures negative neurological ROS  negative psych ROS   GI/Hepatic negative GI ROS, Neg liver ROS,   Endo/Other  negative endocrine ROSneg diabetes  Renal/GU negative Renal ROS     Musculoskeletal negative musculoskeletal ROS (+)   Abdominal (+) + obese,   Peds  Hematology negative hematology ROS (+)   Anesthesia Other Findings Past Medical History: No date: Low testosterone No date: Pneumonia No date: 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 05/2020: Ventral hernia without obstruction or gangrene   Reproductive/Obstetrics                             Anesthesia Physical Anesthesia Plan  ASA: II  Anesthesia Plan: General   Post-op Pain Management:    Induction: Intravenous  PONV Risk Score and Plan: 1  Airway Management Planned: Oral ETT  Additional Equipment:   Intra-op Plan:   Post-operative Plan: Extubation in OR  Informed Consent: I have reviewed the patients History and Physical, chart, labs and discussed the procedure including the risks, benefits and alternatives for the proposed anesthesia with the patient or authorized representative who has indicated his/her understanding and acceptance.      Dental advisory given  Plan Discussed with: CRNA and Anesthesiologist  Anesthesia Plan Comments:         Anesthesia Quick Evaluation

## 2020-06-16 NOTE — Discharge Instructions (Addendum)
  Diet: Resume home heart healthy regular diet.   Activity: No heavy lifting >20 pounds (children, pets, laundry, garbage) or strenuous activity until follow-up, but light activity and walking are encouraged. Do not drive or drink alcohol if taking narcotic pain medications.  Wound care: May shower with soapy water and pat dry (do not rub incisions), but no baths or submerging incision underwater until follow-up. (no swimming)   Medications: Resume all home medications. For mild to moderate pain: acetaminophen (Tylenol) or ibuprofen (if no kidney disease). Combining Tylenol with alcohol can substantially increase your risk of causing liver disease. Narcotic pain medications, if prescribed, can be used for severe pain, though may cause nausea, constipation, and drowsiness. Do not combine Tylenol and Norco within a 6 hour period as Norco contains Tylenol. If you do not need the narcotic pain medication, you do not need to fill the prescription.   TYLENOL 1,000 MG GIVEN AT [place] AT 1:11 PM - MAY HAVE AGAIN IN 6 HRS IF NEEDED TORADOL (anti-inflammatory medicine) GIVEN AT [place] AT 1:58 PM - MAY HAVE IBUPROFEN 6 HOURS FROM THAT IF NEEDED.)  Call office [phone]) at any time if any questions, worsening pain, fevers/chills, bleeding, drainage from incision site, or other concerns.   AMBULATORY SURGERY  DISCHARGE INSTRUCTIONS   1) The drugs that you were given will stay in your system until tomorrow so for the next 24 hours you should not:  A) Drive an automobile B) Make any legal decisions C) Drink any alcoholic beverage   2) You may resume regular meals tomorrow.  Today it is better to start with liquids and gradually work up to solid foods.  You may eat anything you prefer, but it is better to start with liquids, then soup and crackers, and gradually work up to solid foods.   3) Please notify your doctor immediately if you have any unusual bleeding, trouble breathing, redness  and pain at the surgery site, drainage, fever, or pain not relieved by medication.    4) Additional Instructions:        Please contact your physician with any problems or Same Day Surgery at [phone], Monday through Friday 6 am to 4 pm, or [place] at [facility] number at [phone].

## 2020-06-16 NOTE — Anesthesia Procedure Notes (Signed)
Procedure Name: Intubation Date/Time: 06/16/2020 12:50 PM Performed by: Johnna Acosta, CRNA Pre-anesthesia Checklist: Patient identified, Emergency Drugs available, Suction available, Patient being monitored and Timeout performed Patient Re-evaluated:Patient Re-evaluated prior to induction Oxygen Delivery Method: Circle system utilized Preoxygenation: Pre-oxygenation with 100% oxygen Induction Type: IV induction Ventilation: Mask ventilation without difficulty Laryngoscope Size: McGraph and 4 Grade View: Grade II Tube type: Oral Tube size: 7.5 mm Number of attempts: 1 Airway Equipment and Method: Stylet,  Video-laryngoscopy and Oral airway Placement Confirmation: ETT inserted through vocal cords under direct vision,  positive ETCO2 and breath sounds checked- equal and bilateral Secured at: 22 cm Tube secured with: Tape Dental Injury: Teeth and Oropharynx as per pre-operative assessment

## 2020-06-16 NOTE — Transfer of Care (Signed)
Immediate Anesthesia Transfer of Care Note  Patient: Jeremy Clements  Procedure(s) Performed: XI ROBOT ASSISTED UMBILICAL HERNIA REPAIR (N/A Abdomen)  Patient Location: PACU  Anesthesia Type:General  Level of Consciousness: awake, drowsy and patient cooperative  Airway & Oxygen Therapy: Patient Spontanous Breathing and Patient connected to face mask oxygen  Post-op Assessment: Report given to RN and Post -op Vital signs reviewed and stable  Post vital signs: Reviewed and stable  Last Vitals:  Vitals Value Taken Time  BP 147/81 06/16/20 1418  Temp 36.4 C 06/16/20 1418  Pulse 72 06/16/20 1418  Resp 20 06/16/20 1418  SpO2 100 % 06/16/20 1418    Last Pain:  Vitals:   06/16/20 1222  TempSrc: Tympanic  PainSc: 0-No pain         Complications: No complications documented.

## 2020-06-16 NOTE — Anesthesia Postprocedure Evaluation (Signed)
Anesthesia Post Note  Patient: Azad Calame Bursch  Procedure(s) Performed: XI ROBOT ASSISTED UMBILICAL HERNIA REPAIR (N/A Abdomen)  Patient location during evaluation: PACU Anesthesia Type: General Level of consciousness: awake and alert and oriented Pain management: pain level controlled Vital Signs Assessment: post-procedure vital signs reviewed and stable Respiratory status: spontaneous breathing, nonlabored ventilation and respiratory function stable Cardiovascular status: blood pressure returned to baseline and stable Postop Assessment: no signs of nausea or vomiting Anesthetic complications: no   No complications documented.   Last Vitals:  Vitals:   06/16/20 1445 06/16/20 1500  BP: 131/65 139/87  Pulse: 61 71  Resp: 19 (!) 23  Temp:    SpO2: 97% 93%    Last Pain:  Vitals:   06/16/20 1500  TempSrc:   PainSc: 7                  Olamide Lahaie

## 2020-06-16 NOTE — Interval H&P Note (Signed)
History and Physical Interval Note:  06/16/2020 12:29 PM  Jeremy Clements  has presented today for surgery, with the diagnosis of M15.8 Umbilical hernia w/o obstruction or gangrene.  The various methods of treatment have been discussed with the patient and family. After consideration of risks, benefits and other options for treatment, the patient has consented to  Procedure(s): XI [place] (N/A) as a surgical intervention.  The patient's history has been reviewed, patient examined, no change in status, stable for surgery.  I have reviewed the patient's chart and labs.  Questions were answered to the patient's satisfaction.     Herbert Pun

## 2020-06-16 NOTE — Op Note (Signed)
Preoperative diagnosis: Recurrent ventral hernia  Postoperative diagnosis: Recurrent ventral hernia, incarcerated.  Procedure: Robotic assisted laparoscopic recurrent ventral hernia, incarcerated, repair with mesh  Anesthesia: General  Surgeon: Herbert Pun, MD, FACS  Wound Classification: Clean  Specimen: none  Complications: None  Estimated Blood Loss: 5 mL  Indications: A 65 year old male with symptomatic recurrent ventral hernia. Repair indicated to improve pain and avoid complications such as incarceration or strangulation.   Findings: 1. 1.8 cm ventral hernia with incarcerated omentum   2. Tension free repair achieved with 11.4 cm round bard mesh and suture 3. Adequate hemostasis  Description of procedure: The patient was brought to the operating room and general anesthesia was induced. A time-out was completed verifying correct patient, procedure, site, positioning, and implant(s) and/or special equipment prior to beginning this procedure. Antibiotics were administered prior to making the incision. SCDs placed. The anterior abdominal wall was prepped and draped in the standard sterile fashion.   Palmer's point chosen for entry.  Veress needle placed and abdomen insufflated to 15cm without any dramatic increase in pressure.  Needle removed and optiview technique used to place 8 mm port at same point.  No injury noted during placement. 2 additional ports were placed along left lateral aspect.  Xi robot then docked into place.  Hernia contents (incarcerated omentum) noted and reduced with combination of blunt, sharp dissection with scissors and fenestrated forceps.  Hemostasis achieved throughout this portion.  Once all hernia contents reduced, there was noted to be a 1.8 cm hernia.    Insufflation dropped to 46mm and transfacial suture with 0 stratafix used to primarily close defect under minimal tension. Bard protected 11.4 cm round mesh was placed within the abdominal  cavity through the port and secured to the abdominal wall centered over the defect using the 0 stratafix previously used to primarily close defect.  The mesh was then circumferentially sutured into the anterior abdominal wall using 2-0 VLock x2.  Any bleeding noted during this portion was no longer actively bleeding by end of securing mesh and tightening the suture.   Robot was undocked.  Abdomen then desufflated while camera within abdomen to ensure no signs of new bleed prior to removing camera and rest of ports completely.  All skin incisions closed with runninrg 4-0 Monocryl in a subcuticular fashion.  All wounds then dressed with Dermabond.  Patient was then successfully awakened and transferred to PACU in stable condition.  At the end of the procedure sponge and instrument counts were correct.

## 2020-06-17 NOTE — Progress Notes (Signed)
Patient only took 1 pain pill last night. Has been having lot of pain.  Went over rotating motrin and his pain medication to get it under control.  He is c/o upper abdominal pain.  Explained the CO2 from robot and to get up and walk some to help move the gas.  If this does not help instructed patient to call surgery office and discuss with them.  Verbalized understanding.

## 2021-01-19 DIAGNOSIS — E119 Type 2 diabetes mellitus without complications: Secondary | ICD-10-CM | POA: Insufficient documentation

## 2021-06-08 ENCOUNTER — Ambulatory Visit: Payer: BC Managed Care – PPO | Admitting: Dermatology

## 2021-07-16 DIAGNOSIS — I480 Paroxysmal atrial fibrillation: Secondary | ICD-10-CM | POA: Insufficient documentation

## 2022-07-26 ENCOUNTER — Other Ambulatory Visit: Payer: Self-pay | Admitting: Otolaryngology

## 2022-07-26 DIAGNOSIS — R221 Localized swelling, mass and lump, neck: Secondary | ICD-10-CM

## 2022-08-11 ENCOUNTER — Ambulatory Visit
Admission: RE | Admit: 2022-08-11 | Discharge: 2022-08-11 | Disposition: A | Discharge status: Home / Self Care | Payer: Medicare Other | Source: Ambulatory Visit | Attending: Otolaryngology | Admitting: Otolaryngology

## 2022-08-11 DIAGNOSIS — R221 Localized swelling, mass and lump, neck: Secondary | ICD-10-CM

## 2022-10-15 ENCOUNTER — Other Ambulatory Visit: Payer: Self-pay | Admitting: Otolaryngology

## 2022-10-15 DIAGNOSIS — R221 Localized swelling, mass and lump, neck: Secondary | ICD-10-CM

## 2022-10-21 ENCOUNTER — Ambulatory Visit
Admission: RE | Admit: 2022-10-21 | Discharge: 2022-10-21 | Disposition: A | Discharge status: Home / Self Care | Payer: Medicare Other | Source: Ambulatory Visit | Attending: Otolaryngology | Admitting: Otolaryngology

## 2022-10-21 DIAGNOSIS — R221 Localized swelling, mass and lump, neck: Secondary | ICD-10-CM

## 2022-10-21 MED ORDER — IOPAMIDOL (ISOVUE-300) INJECTION 61%
75.0000 mL | Freq: Once | INTRAVENOUS | Status: AC | PRN
  Administered 2022-10-21: 75 mL via INTRAVENOUS

## 2022-10-22 ENCOUNTER — Other Ambulatory Visit: Payer: Self-pay | Admitting: Otolaryngology

## 2022-10-22 DIAGNOSIS — R1310 Dysphagia, unspecified: Secondary | ICD-10-CM

## 2022-10-22 DIAGNOSIS — R633 Feeding difficulties, unspecified: Secondary | ICD-10-CM

## 2022-10-22 DIAGNOSIS — R1319 Other dysphagia: Secondary | ICD-10-CM

## 2022-11-09 IMAGING — CT CT ABD-PELV W/ CM
1 of 3 series · 13 of 32 positions shown, 18 images · IV contrast (APPLIED)
Comparison: None.

CLINICAL DATA: Fall last week. Left-sided abdominal pain and flank
pain. Paraumbilical bulging.

EXAM:
CT ABDOMEN AND PELVIS WITH CONTRAST
TECHNIQUE: Multidetector CT imaging of the abdomen and pelvis was performed
using the standard protocol following bolus administration of
intravenous contrast.
CONTRAST:  100mL OMNIPAQUE IOHEXOL 300 MG/ML  SOLN

[Series 2: axial st · axial · 0.98mm/px · z∈[-1060,-556]mm · 13 of 115 slices shown, 18 images]
[im 7/115  soft-tissue]
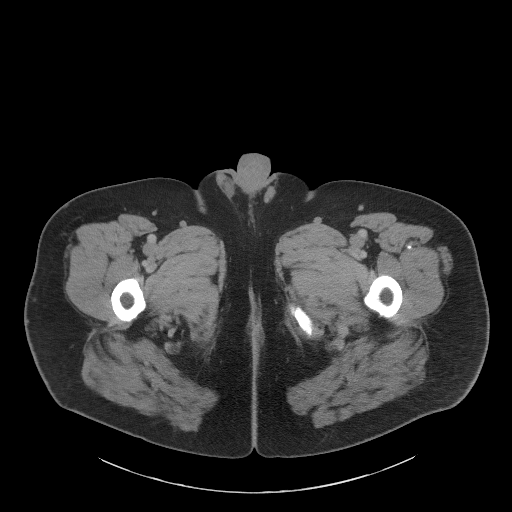
[im 7/115  bone]
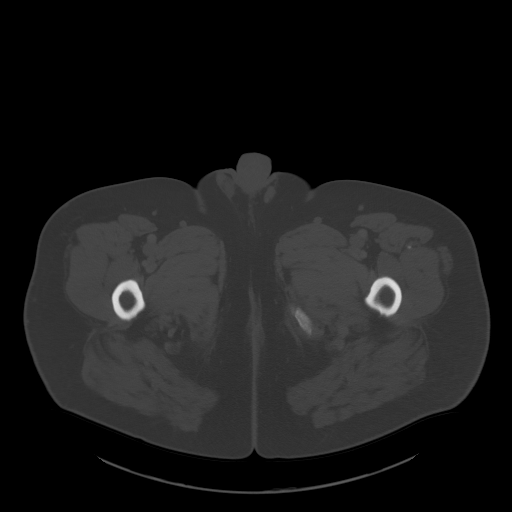
[im 21/115  soft-tissue]
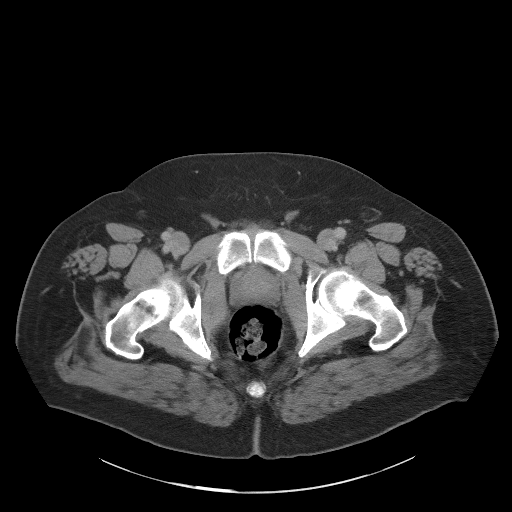
[im 27/115  soft-tissue]
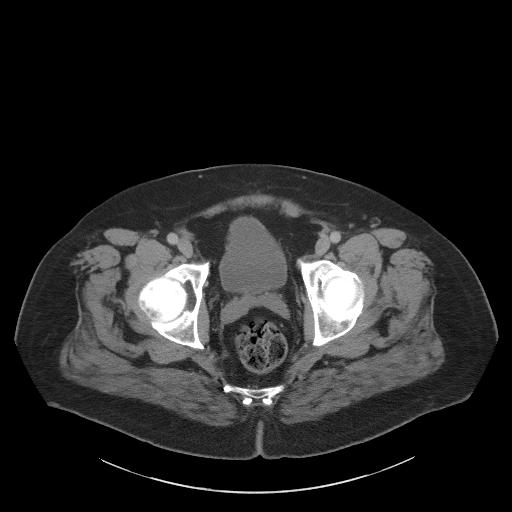
[im 34/115  soft-tissue]
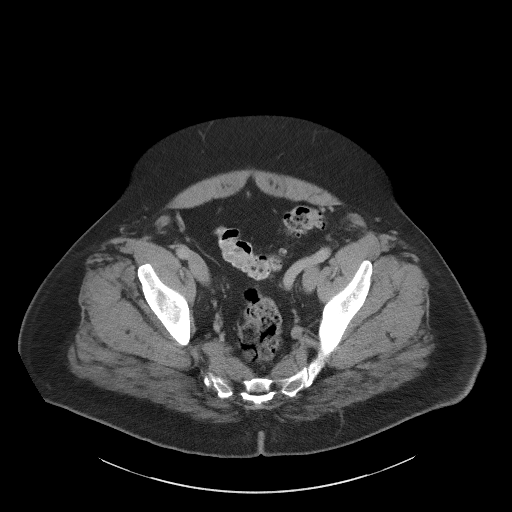
[im 47/115  soft-tissue]
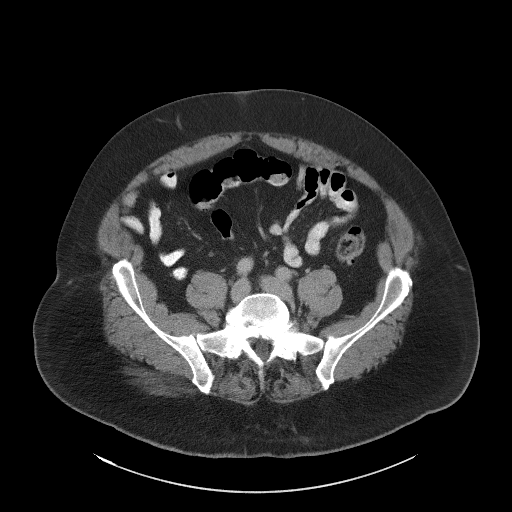
[im 54/115  soft-tissue]
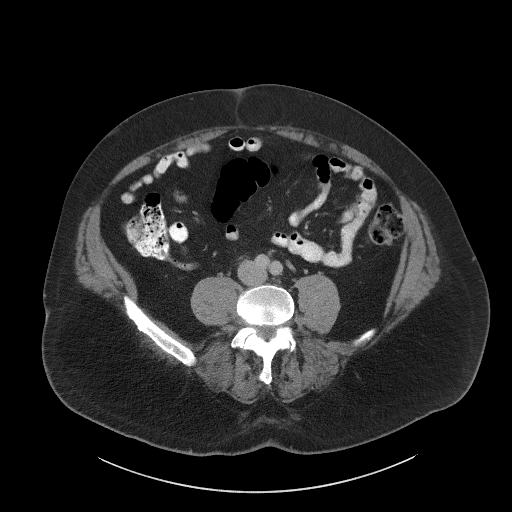
[im 61/115  soft-tissue]
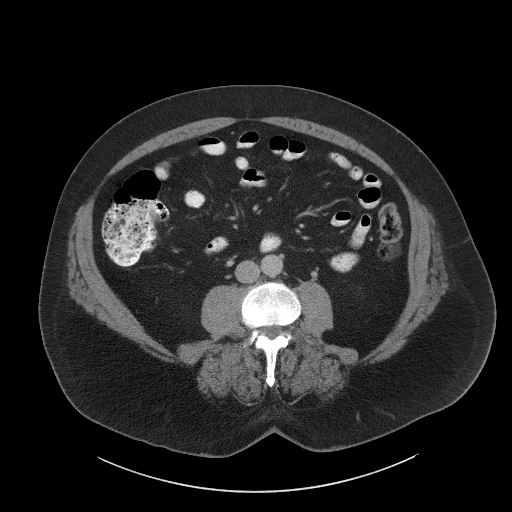
[im 74/115  soft-tissue]
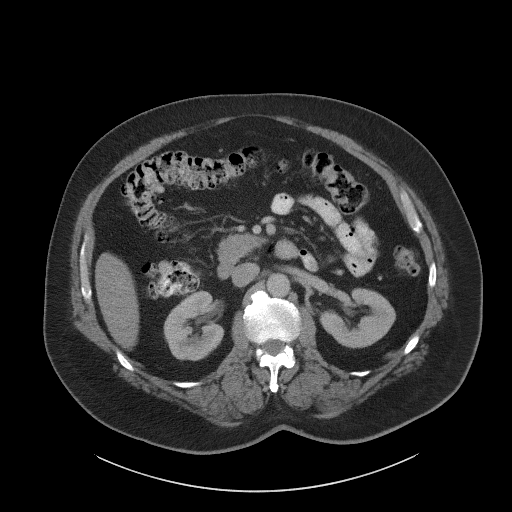
[im 81/115  soft-tissue]
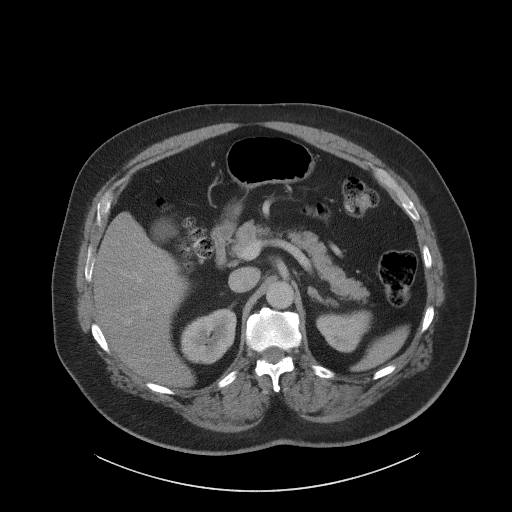
[im 81/115  bone]
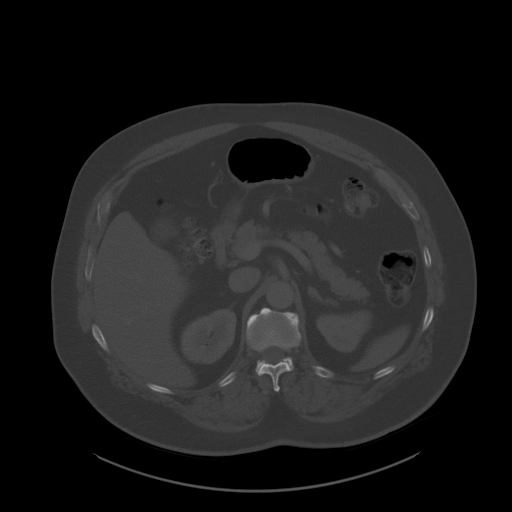
[im 88/115  soft-tissue]
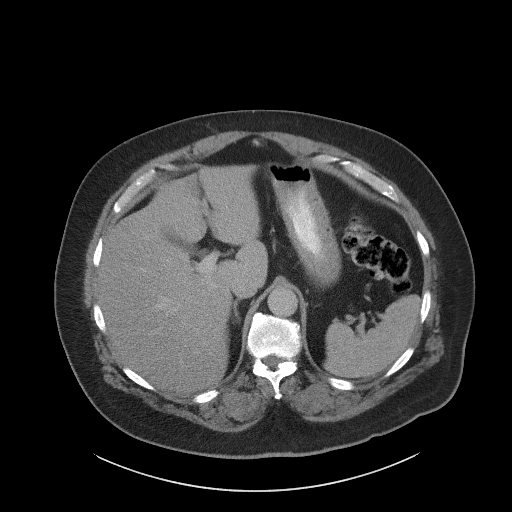
[im 88/115  lung]
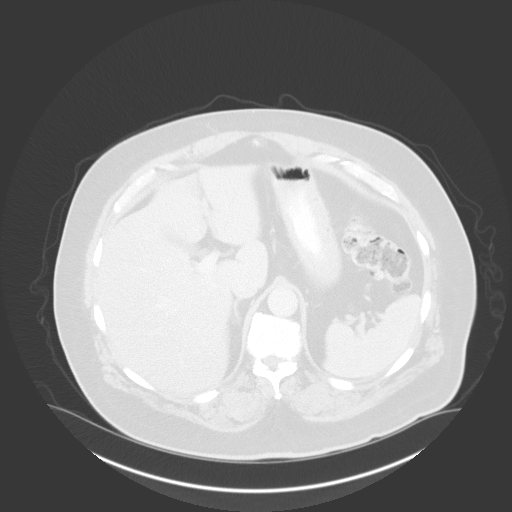
[im 94/115  lung]
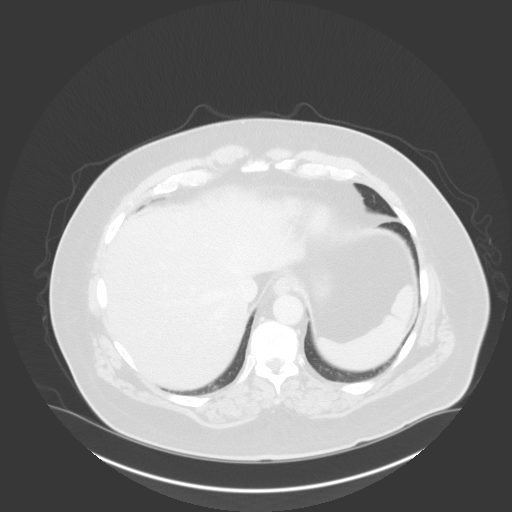
[im 101/115  soft-tissue]
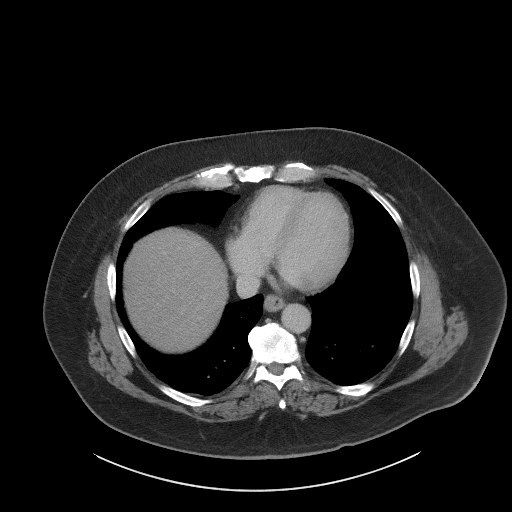
[im 101/115  lung]
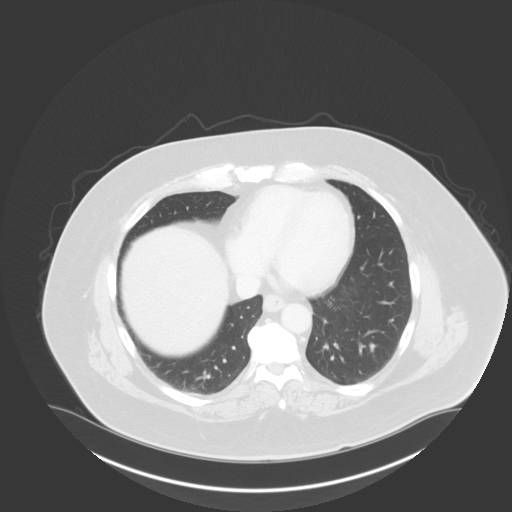
[im 108/115  soft-tissue]
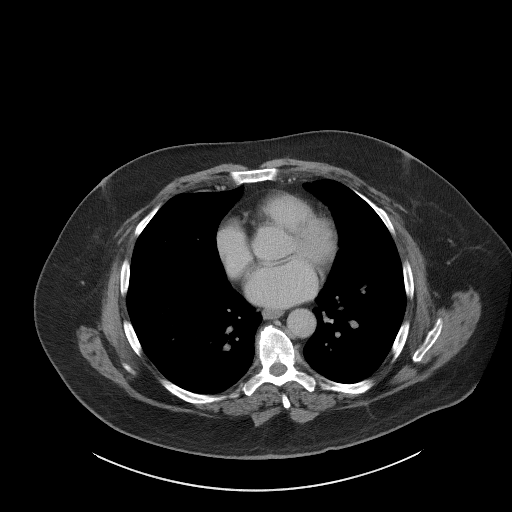
[im 108/115  lung]
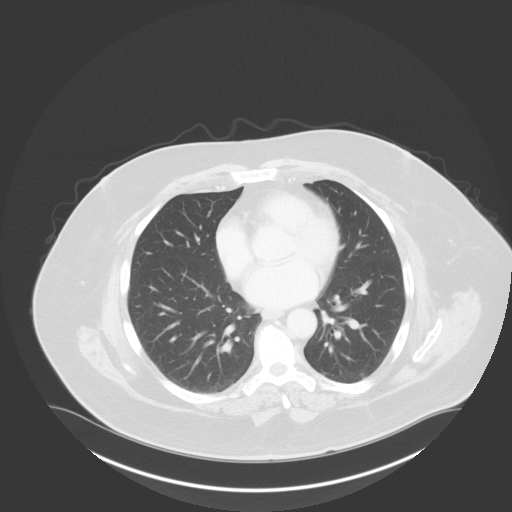

[13 of 32 positions shown; findings below may reference images not displayed]

FINDINGS: Lower chest: Mild dependent atelectasis is present. No nodule, mass,
or airspace disease is present. The heart size is normal. No
significant pleural or pericardial effusion is present.

Hepatobiliary: Mild fatty infiltration liver is noted. No discrete
lesions are present. Common bile duct and gallbladder are within
normal limits.

Pancreas: Unremarkable. No pancreatic ductal dilatation or
surrounding inflammatory changes.

Spleen: Normal in size without focal abnormality.

Adrenals/Urinary Tract: Adrenal glands are normal bilaterally.
Kidneys and ureters are within normal limits. No stone or mass
lesion is present. No obstruction is present. The urinary bladder is
within normal limits.

Stomach/Bowel: The stomach and duodenum are within normal limits.
Small bowel is unremarkable. The terminal ileum is within normal
limits. The appendix is visualized and normal. The ascending and
transverse colon are within normal limits. Diverticular changes are
present in the distal descending and sigmoid colon without
inflammation to suggest diverticulitis. Moderate stool is present in
the rectum without obstruction.

Vascular/Lymphatic: No significant vascular findings are present. No
enlarged abdominal or pelvic lymph nodes.

Reproductive: Prostate is unremarkable.

Other: A left paramedian supraumbilical ventral hernia contains fat
without bowel. The opening is 12 mm. No other significant ventral
hernia is present. No free fluid or free air is present.

Musculoskeletal: Multilevel degenerative changes are present in the
lumbar spine with mild leftward curvature. Vacuum disc is present at
L5-S1. Endplate and facet spurring contribute to severe bilateral
foraminal stenosis at L5-S1. Mild narrowing is worse right than left
at L3-4 and L4-5. No focal lytic or blastic lesions are present.
Mild degenerative changes are noted in the SI joints bilaterally.
The hips are located and within normal limits.
IMPRESSION: 1. No nephrolithiasis or renal obstruction.
2. Left paramedian supraumbilical ventral hernia contains fat
without bowel. The opening is 12 mm.
3. No other significant ventral hernia.
4. Hepatic steatosis.
5. Descending and sigmoid diverticulosis without diverticulitis.
6. Severe bilateral foraminal stenosis at L5-S1.

## 2022-11-10 ENCOUNTER — Ambulatory Visit
Admission: RE | Admit: 2022-11-10 | Discharge: 2022-11-10 | Disposition: A | Discharge status: Home / Self Care | Payer: Medicare Other | Source: Ambulatory Visit | Attending: Otolaryngology | Admitting: Otolaryngology

## 2022-11-10 DIAGNOSIS — R633 Feeding difficulties, unspecified: Secondary | ICD-10-CM | POA: Diagnosis present

## 2022-11-10 DIAGNOSIS — R1310 Dysphagia, unspecified: Secondary | ICD-10-CM | POA: Insufficient documentation

## 2022-11-10 NOTE — Progress Notes (Addendum)
Modified Barium Swallow Study  Patient Details  Name: Jeremy Clements MRN: [id number] Date of Birth: 09/29/1955  Today's Date: 11/10/2022  Modified Barium Swallow completed.  Full report located under Chart Review in the Imaging Section.  History of Present Illness Pt is a 67 y.o. w/ a PMH of DM2/A1c 6.2, paroxysmal A-fib/ablation, lumbar disc disease, obesity, and new dx of REFLUX recommended to be on a PPI by ENT(referring MD) which he hasn't started yet who describes difficulty swallowing during meals intermittently "sometimes it won't happen for TWO months" in which describes Esophageal discomfort/dysmotility and Belching(which relieves the "pressure - maybe air swallowed") and also c/o sinus drainage with waking up at night not be able to "clear it" and Throat Clearing. He endorsed he feels his sleep is affected at night by the sinus drainage and possble "sleep apnea - I've been told to get a test for it".  Per recent MD note 10/04/2022: Lungs CTAB. Body weight 275lbs.  Noted imaging on 10/21/2022 re: "Asymmetry of the subcutaneous fat along the left platysma at the level of the hyoid bone, corresponding to fatty mass seen on prior   ultrasound, consistent with a benign lipoma.", per chart.   Pt endorsed Xerostomia daily/nightly; pt is a Mouth-Breather when sleeping and is on Eliquis(blood thinner) medication which he started ~1 yr ago and felt that "this dry mouth has increased since then too". Encouraged him to speak w/ his MD further re: this if any questions. Education given on Xerostomia and oral rinses; f/u w/ PCP for further management.   OF NOTE: Pt had a MBSS in 10/2017 w/ similar c/o a "blockage" in his Esophageal area and inconsistent feelings of difficulty swallowing at meals.  He has not c/o difficulty swallowing Pills.   Clinical Impression Patient presents with functional oropharyngeal phase swallowing. No laryngeal penetration nor aspiration noted during this study. Cervical  Esophageal phase(viewable) motility appeared [facility]. Pt may benefit from further evaluation w/ GI to assess (full) Esophageal phase motility in setting of his c/o of "blockage" and occasional "pain" and subjective feeling of slow movement through the Esophagus as well as need to Belch and "release the pressure" -- indicating the issues are in his mid-Esophageal region(pointing to area), and education re: REFLUX dx.  Patient's complaints do not appear related to any oropharyngeal swallow dysfunction.   Oral phase is characterized by adequate lip closure, bolus preparation, rotary mastication, and containment, and anterior to posterior transit. Swallow initiation occurs at the level of the posterior ramus/BOT. Pharyngeal stage is noted for adequate tongue base retraction, adequate hyolaryngeal excursion, and adequate pharyngeal constriction. Epiglottic inversion is Complete; there is No penetration nor aspiration. There is No pharyngeal residue. Pharyngeal stripping wave is complete. Amplitude/duration of cricopharyngeus opening is WFL. There is adequate/complete clearance through the cervical esophagus.  Consistencies tested were thin liquids x2 tsps, 1 cup sip, 4 sequential sips, nectar x1 tsp, 1 cup sip, 3 sequential cup sips, honey x1 tsp, pudding x1 tsp, regular solid (1/2 graham cracker with pudding). Pt did not report any globus sensation during/post study.  Recommend patient continue regular diet with thin liquids; educated pt verbally re: general REFLUX precautions including: SMALL, single bites SLOWLY to allow for clearing, moistening dry foods, monitoring intake of tough meats, alternating solids and liquids, stop eating/drinking 3 hours prior to bedtime, monitoring foods/drinks that might increase REFLUX s/s, and initiate the PPI ordered by MD. Recommended f/u by GI for further assessment and education re: Esophageal phase Dysmotility. Thoroughly discussed general swallowing  and different factors that  can affect it including REFLUX and air swallowed.  No further ST indicated. Factors that may increase risk of adverse event in presence of aspiration Rubye Oaks & Clearance Coots 2021):  (n/a)    Swallow Evaluation Recommendations Recommendations: PO diet PO Diet Recommendation: Regular;Thin liquids (Level 0) (as baseline) Liquid Administration via: Cup (less air swallowing) Medication Administration: Whole meds with liquid (as baseline) Supervision: Patient able to self-feed Swallowing strategies  : Slow rate;Small bites/sips (alternate foods/liquids) Postural changes: Position pt fully upright for meals;Stay upright 30-60 min after meals;Out of bed for meals (REFLUX precautions) Oral care recommendations: Oral care BID (2x/day);Pt independent with oral care Recommended consults: Consider GI consultation;Consider esophageal assessment        Jerilynn Som, MS, CCC-SLP Speech Language Pathologist Rehab Services; [facility] [phone] (ascom) Shealeigh Dunstan 11/10/2022,4:39 PM

## 2023-08-17 ENCOUNTER — Other Ambulatory Visit: Payer: Self-pay | Admitting: Otolaryngology

## 2023-08-17 DIAGNOSIS — R221 Localized swelling, mass and lump, neck: Secondary | ICD-10-CM

## 2023-08-31 ENCOUNTER — Ambulatory Visit
Admission: RE | Admit: 2023-08-31 | Discharge: 2023-08-31 | Disposition: A | Discharge status: Home / Self Care | Payer: PRIVATE HEALTH INSURANCE | Source: Ambulatory Visit | Attending: Otolaryngology | Admitting: Otolaryngology

## 2023-08-31 DIAGNOSIS — R221 Localized swelling, mass and lump, neck: Secondary | ICD-10-CM

## 2023-10-20 ENCOUNTER — Other Ambulatory Visit: Payer: Self-pay | Admitting: Podiatry

## 2023-10-20 ENCOUNTER — Ambulatory Visit
Admission: RE | Admit: 2023-10-20 | Discharge: 2023-10-20 | Disposition: A | Discharge status: Home / Self Care | Source: Ambulatory Visit | Attending: Podiatry | Admitting: Podiatry

## 2023-10-20 DIAGNOSIS — S93601D Unspecified sprain of right foot, subsequent encounter: Secondary | ICD-10-CM

## 2023-10-31 ENCOUNTER — Other Ambulatory Visit: Payer: Self-pay | Admitting: Physical Medicine & Rehabilitation

## 2023-10-31 DIAGNOSIS — M5441 Lumbago with sciatica, right side: Secondary | ICD-10-CM

## 2023-11-02 ENCOUNTER — Ambulatory Visit
Admission: RE | Admit: 2023-11-02 | Discharge: 2023-11-02 | Disposition: A | Discharge status: Home / Self Care | Source: Ambulatory Visit | Attending: Physical Medicine & Rehabilitation | Admitting: Physical Medicine & Rehabilitation

## 2023-11-02 DIAGNOSIS — M5441 Lumbago with sciatica, right side: Secondary | ICD-10-CM

## 2023-11-04 ENCOUNTER — Other Ambulatory Visit: Payer: Self-pay | Admitting: Physical Medicine & Rehabilitation

## 2023-11-04 ENCOUNTER — Encounter: Payer: Self-pay | Admitting: Physical Medicine & Rehabilitation

## 2023-11-04 DIAGNOSIS — M5441 Lumbago with sciatica, right side: Secondary | ICD-10-CM

## 2023-11-08 ENCOUNTER — Other Ambulatory Visit: Payer: Self-pay | Admitting: Physical Medicine & Rehabilitation

## 2023-11-08 DIAGNOSIS — D492 Neoplasm of unspecified behavior of bone, soft tissue, and skin: Secondary | ICD-10-CM

## 2023-11-09 ENCOUNTER — Other Ambulatory Visit

## 2023-11-10 ENCOUNTER — Ambulatory Visit
Admission: RE | Admit: 2023-11-10 | Discharge: 2023-11-10 | Disposition: A | Discharge status: Home / Self Care | Source: Ambulatory Visit | Attending: Physical Medicine & Rehabilitation | Admitting: Physical Medicine & Rehabilitation

## 2023-11-10 DIAGNOSIS — D492 Neoplasm of unspecified behavior of bone, soft tissue, and skin: Secondary | ICD-10-CM

## 2023-11-10 MED ORDER — IOPAMIDOL (ISOVUE-300) INJECTION 61%
100.0000 mL | Freq: Once | INTRAVENOUS | Status: AC | PRN
  Administered 2023-11-10: 100 mL via INTRAVENOUS

## 2023-11-16 ENCOUNTER — Other Ambulatory Visit: Payer: Self-pay | Admitting: Physical Medicine & Rehabilitation

## 2023-11-16 DIAGNOSIS — D492 Neoplasm of unspecified behavior of bone, soft tissue, and skin: Secondary | ICD-10-CM

## 2023-12-06 ENCOUNTER — Ambulatory Visit

## 2023-12-06 DIAGNOSIS — K5730 Diverticulosis of large intestine without perforation or abscess without bleeding: Secondary | ICD-10-CM | POA: Diagnosis not present

## 2023-12-06 DIAGNOSIS — K640 First degree hemorrhoids: Secondary | ICD-10-CM | POA: Diagnosis not present

## 2023-12-06 DIAGNOSIS — Z1211 Encounter for screening for malignant neoplasm of colon: Secondary | ICD-10-CM | POA: Diagnosis not present

## 2023-12-20 ENCOUNTER — Encounter: Payer: Self-pay | Admitting: Neurosurgery

## 2023-12-20 DIAGNOSIS — M51369 Other intervertebral disc degeneration, lumbar region without mention of lumbar back pain or lower extremity pain: Secondary | ICD-10-CM | POA: Insufficient documentation

## 2023-12-20 DIAGNOSIS — M7540 Impingement syndrome of unspecified shoulder: Secondary | ICD-10-CM | POA: Insufficient documentation

## 2023-12-20 NOTE — Progress Notes (Signed)
 Referring Physician:  Dodson Delon FERNS, MD [address] [place],  [place] [address]  Primary Physician:  Cleotilde Oneil FALCON, MD  History of Present Illness: 12/21/2023 Mr. Hilery Wintle is here today with a chief complaint of back pain into the right leg.  He has a history of a S1/S2 nerve sheath tumor.  He has no new numbness weakness or tingling.  He did get some pain in his leg when he was getting hip injections.  He states that the pain radiates from his back to the front of his knee.  It is electric like in nature.  He had a CT scan which demonstrated a possible nerve sheath tumor.  He is here today for evaluation.  He does not have any family history of nerve sheath tumors.  Does not have any other stigmata of N   Bowel/Bladder Dysfunction: none  Conservative measures:  Physical therapy:  has not participated in Multimodal medical therapy including regular antiinflammatories:  Aleve Injections:  no epidural steroid injections  Past Surgery: no spine surgery  The symptoms are causing a significant impact on the patient's life.   I have utilized the care everywhere function in epic to review the outside records available from external health systems.  Review of Systems:  A 10 point review of systems is negative, except for the pertinent positives and negatives detailed in the HPI.  Past Medical History: Past Medical History:  Diagnosis Date   Low testosterone    Pneumonia    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    Ventral hernia without obstruction or gangrene 05/2020    Past Surgical History: Past Surgical History:  Procedure Laterality Date   COLONOSCOPY  2014   CYSTECTOMY     on Testicle   ESOPHAGOGASTRODUODENOSCOPY ENDOSCOPY  2019   LESION REMOVAL Left    squamous cell skin cancer hand    Allergies: Allergies as of 12/21/2023 - Review Complete 06/16/2020  Allergen Reaction Noted   Amoxil [amoxicillin] Rash 06/03/2020   Tetracyclines & related Rash  09/25/2013    Medications:  Current Outpatient Medications:    ACCU-CHEK AVIVA PLUS test strip, once daily for 30 days Use as instructed., Disp: , Rfl:    Accu-Chek Softclix Lancets lancets, daily., Disp: , Rfl:    gabapentin (NEURONTIN) 100 MG capsule, Take 100 mg by mouth at bedtime., Disp: , Rfl:    losartan-hydrochlorothiazide (HYZAAR) 50-12.5 MG tablet, Take 1 tablet by mouth daily., Disp: , Rfl:    Ascorbic Acid (VITAMIN C) 1000 MG tablet, Take 2,000 mg by mouth daily., Disp: , Rfl:    aspirin-acetaminophen -caffeine (EXCEDRIN MIGRAINE) 250-250-65 MG tablet, Take 1 tablet by mouth 2 (two) times daily as needed for headache., Disp: , Rfl:    cholecalciferol (VITAMIN D) 25 MCG (1000 UNIT) tablet, Take 1,000 Units by mouth daily., Disp: , Rfl:    clotrimazole-betamethasone (LOTRISONE) cream, Apply topically daily., Disp: , Rfl:    cyanocobalamin (VITAMIN B12) 1000 MCG tablet, Take 1,000 mcg by mouth daily., Disp: , Rfl:    ELDERBERRY PO, Take 2,000 mg by mouth daily., Disp: , Rfl:    ELIQUIS 5 MG TABS tablet, Take 5 mg by mouth 2 (two) times daily., Disp: , Rfl:    metoprolol tartrate (LOPRESSOR) 25 MG tablet, , Disp: , Rfl:    naproxen sodium (ALEVE) 220 MG tablet, Take 220 mg by mouth 2 (two) times daily as needed (pain)., Disp: , Rfl:    Zinc 50 MG CAPS, Take  50 mg by mouth daily., Disp: , Rfl:   Social History: Social History   Tobacco Use   Smoking status: Never   Smokeless tobacco: Never  Vaping Use   Vaping status: Never Used  Substance Use Topics   Alcohol use: Never   Drug use: Never    Family Medical History: No family history on file.  Physical Examination: There were no vitals filed for this visit.  General: Patient is in no apparent distress. Attention to examination is appropriate.  Neck:   Supple.  Full range of motion.  Respiratory: Patient is breathing without any difficulty.   NEUROLOGICAL:     Awake, alert, oriented to person, place, and time.   Speech is clear and fluent.   Cranial Nerves: Pupils equal round and reactive to light.  Facial tone is symmetric.  Facial sensation is symmetric. Shoulder shrug is symmetric. Tongue protrusion is midline.    Strength: Motor examination without deficit.  Sensory examination without deficit.  Imaging: Narrative & Impression  CLINICAL DATA:  Acute back pain with right sciatica. No known injury. Patient indicates issues for 2 years with pain extending into the hip, leg and foot.   EXAM: CT LUMBAR SPINE WITHOUT CONTRAST   TECHNIQUE: Multidetector CT imaging of the lumbar spine was performed without intravenous contrast administration. Multiplanar CT image reconstructions were also generated.   RADIATION DOSE REDUCTION: This exam was performed according to the departmental dose-optimization program which includes automated exposure control, adjustment of the mA and/or kV according to patient size and/or use of iterative reconstruction technique.   COMPARISON:  Abdominopelvic CT 05/29/2020   FINDINGS: Segmentation: There are 5 lumbar type vertebral bodies.   Alignment: Minimal convex left scoliosis. No focal angulation or significant listhesis.   Vertebrae: No evidence of acute fracture, pars defect, discitis or osteomyelitis. The lumbar pedicles are somewhat short on a congenital basis.   Paraspinal and other soft tissues: No acute paraspinal findings. Mild iliac atherosclerosis. There is apparent fusiform enlargement of the right S2 nerve root anterior to the mid sacrum, measuring up to 2.1 x 1.6 cm on image 135/2. This is grossly unchanged from the previous pelvic CT, but suspicious for a nerve sheath tumor.   Disc levels:   L1-2: Preserved disc height with mild anterior osteophytes. No significant spinal stenosis or foraminal narrowing.   L2-3: Mild loss of disc height with disc bulging, posterior osteophytes and bilateral facet hypertrophy. Mild spinal stenosis and  lateral recess narrowing. No significant foraminal narrowing.   L3-4: Mild loss of disc height with annular disc bulging and endplate osteophytes asymmetric to the right. Mild facet and ligamentous hypertrophy. Mild to moderate multifactorial spinal stenosis with mild narrowing of the lateral recesses and right foramen.   L4-5: Mild loss of disc height with annular disc bulging, facet and ligamentous hypertrophy. Resulting mild to moderate multifactorial spinal stenosis with mild lateral recess and foraminal narrowing bilaterally.   L5-S1: Chronic degenerative disc disease with severe loss of disc height, endplate osteophytes and vacuum phenomenon. Moderate bilateral facet hypertrophy. The spinal canal appears adequately patent. There is chronic moderate lateral recess and severe foraminal narrowing bilaterally which appears unchanged.   IMPRESSION: 1. No acute osseous findings or significant malalignment. 2. Chronic degenerative disc disease at L5-S1 with resulting moderate lateral recess and severe foraminal narrowing bilaterally, similar to previous abdominopelvic CT. 3. Mild to moderate multifactorial spinal stenosis at L3-4 and L4-5 with mild lateral recess and foraminal narrowing at both levels. 4. Fusiform enlargement of the  right S2 nerve root anterior to the mid sacrum, suspicious for a nerve sheath tumor. This is grossly unchanged from the previous pelvic CT. MRI of the sacrum with and without contrast recommended for further evaluation.     Electronically Signed   By: Elsie Perone M.D.   On: 11/02/2023 12:02   I have personally reviewed the images and agree with the above interpretation.  Medical Decision Making/Assessment and Plan: Mr. Wlodarczyk is a pleasant 68 y.o. 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  This appears to be stable from his previous CT.  He does have some hip/leg pain however this does not appear to go into the S1 or  S2 type distribution.  I did discuss with him that these lesions can come out if they are having excess growth, worsening numbness or tingling that fit the correct myotome and dermatome, or concern for malignancy.  At this point he does not show any of these risk factors.  I would avoid surgery and would prefer to watch this expectantly with new imaging.  He currently has an MRI of the sacrum ordered with and without contrast.  This will help to evaluate the lesion more closely.  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  Will plan to continue to watch.  At this point does not have an indication for resection.   Penne MICAEL Sharps MD/MSCR Neurosurgery

## 2023-12-21 ENCOUNTER — Ambulatory Visit: Admitting: Neurosurgery

## 2023-12-21 VITALS — BP 122/76 | Ht 74.0 in | Wt 265.5 lb

## 2023-12-21 DIAGNOSIS — G549 Nerve root and plexus disorder, unspecified: Secondary | ICD-10-CM | POA: Diagnosis not present

## 2023-12-21 DIAGNOSIS — M549 Dorsalgia, unspecified: Secondary | ICD-10-CM

## 2023-12-21 DIAGNOSIS — D492 Neoplasm of unspecified behavior of bone, soft tissue, and skin: Secondary | ICD-10-CM

## 2024-04-19 ENCOUNTER — Other Ambulatory Visit: Payer: Self-pay | Admitting: Otolaryngology

## 2024-04-19 DIAGNOSIS — R221 Localized swelling, mass and lump, neck: Secondary | ICD-10-CM

## 2024-09-06 ENCOUNTER — Other Ambulatory Visit
